# Patient Record
Sex: Male | Born: 1965 | Race: Black or African American | Hispanic: No | Marital: Single | Smoking: Never smoker
Health system: Southern US, Community
[De-identification: ages and names within clinical notes are randomized; demographics above are authoritative.]

## PROBLEM LIST (undated history)

## (undated) ENCOUNTER — Ambulatory Visit (HOSPITAL_COMMUNITY): Discharge status: Absent without leave | Payer: Self-pay

## (undated) DIAGNOSIS — F1910 Other psychoactive substance abuse, uncomplicated: Secondary | ICD-10-CM

## (undated) DIAGNOSIS — Z789 Other specified health status: Secondary | ICD-10-CM

---

## 2017-05-22 ENCOUNTER — Emergency Department (HOSPITAL_COMMUNITY)
Admission: EM | Admit: 2017-05-22 | Discharge: 2017-05-22 | Disposition: A | Discharge status: Home / Self Care | Payer: Self-pay | Attending: Emergency Medicine | Admitting: Emergency Medicine

## 2017-05-22 DIAGNOSIS — F1010 Alcohol abuse, uncomplicated: Secondary | ICD-10-CM | POA: Insufficient documentation

## 2017-05-22 DIAGNOSIS — R030 Elevated blood-pressure reading, without diagnosis of hypertension: Secondary | ICD-10-CM | POA: Insufficient documentation

## 2017-05-22 DIAGNOSIS — R531 Weakness: Secondary | ICD-10-CM | POA: Insufficient documentation

## 2017-05-22 LAB — COMPREHENSIVE METABOLIC PANEL
ALK PHOS: 39 U/L (ref 38–126)
ALT: 21 U/L (ref 17–63)
AST: 24 U/L (ref 15–41)
Albumin: 4 g/dL (ref 3.5–5.0)
Anion gap: 7 (ref 5–15)
BILIRUBIN TOTAL: 1.5 mg/dL — AB (ref 0.3–1.2)
BUN: 8 mg/dL (ref 6–20)
CALCIUM: 9.2 mg/dL (ref 8.9–10.3)
CO2: 23 mmol/L (ref 22–32)
Chloride: 106 mmol/L (ref 101–111)
Creatinine, Ser: 0.98 mg/dL (ref 0.61–1.24)
GFR calc Af Amer: >60 mL/min (ref >60)
GLUCOSE: 103 mg/dL — AB (ref 65–99)
POTASSIUM: 3.5 mmol/L (ref 3.5–5.1)
Sodium: 136 mmol/L (ref 135–145)
TOTAL PROTEIN: 7.3 g/dL (ref 6.5–8.1)

## 2017-05-22 LAB — CBC
HCT: 42 % (ref 39.0–52.0)
Hemoglobin: 14.2 g/dL (ref 13.0–17.0)
MCH: 28.6 pg (ref 26.0–34.0)
MCHC: 33.8 g/dL (ref 30.0–36.0)
MCV: 84.5 fL (ref 78.0–100.0)
PLATELETS: 233 10*3/uL (ref 150–400)
RBC: 4.97 MIL/uL (ref 4.22–5.81)
RDW: 13.1 % (ref 11.5–15.5)
WBC: 5.2 10*3/uL (ref 4.0–10.5)

## 2017-05-22 LAB — URINALYSIS, ROUTINE W REFLEX MICROSCOPIC
BILIRUBIN URINE: NEGATIVE
Glucose, UA: NEGATIVE mg/dL
Hgb urine dipstick: NEGATIVE
KETONES UR: NEGATIVE mg/dL
LEUKOCYTES UA: NEGATIVE
NITRITE: NEGATIVE
PROTEIN: NEGATIVE mg/dL
Specific Gravity, Urine: 1.004 — ABNORMAL LOW (ref 1.005–1.030)
pH: 6 (ref 5.0–8.0)

## 2017-05-22 LAB — I-STAT TROPONIN, ED: TROPONIN I, POC: 0 ng/mL (ref 0.00–0.08)

## 2017-05-22 LAB — LIPASE, BLOOD: Lipase: 39 U/L (ref 11–51)

## 2017-05-22 MED ORDER — SODIUM CHLORIDE 0.9 % IV BOLUS (SEPSIS)
1000.0000 mL | Freq: Once | INTRAVENOUS | Status: AC
  Administered 2017-05-22: 1000 mL via INTRAVENOUS

## 2017-05-22 NOTE — Discharge Instructions (Signed)
Make sure that you drink at least six 8 ounce glasses of water or Gatorade each day in order to stay well-hydrated. Call any of the numbers on the resource guide to get help with your alcohol problem. Call the number on these discharge instructions to get a primary care physician. Your blood pressure is elevated today at 162/ 79 and should be rechecked in a week at an urgent care center or at your new primary care physician's office

## 2017-05-22 NOTE — ED Provider Notes (Signed)
[facility] Provider Note   CSN: [id number] Arrival date & time: 05/22/17  1412     History   Chief Complaint Chief Complaint  Patient presents with  . Abdominal Pain    HPI Justin Middleton is a 51 y.o. male.complains of feelinglightheaded and generally weak for the past 2 days reports his stomach feels as if it's ""boiling" symptoms are not affected by eating or exertion.last bowel movement yesterday, normal. No fever. No treatment prior to coming here. Nothing makes symptoms better or worse. No chest pain no shortness of breath no nausea or vomiting.  HPI  No past medical history on file. Past medical history negative There are no active problems to display for this patient.   No past surgical history on file.     Home Medications    Prior to Admission medications   Not on File    Family History No family history on file.  Social History Social History  Substance Use Topics  . Smoking status: Not on file  . Smokeless tobacco: Not on file  . Alcohol use Not on file   No no tobacco or drinks approximately one sixpack of beer per day. Admits to marijuana use denies IV drug use  Allergies   Patient has no known allergies.   Review of Systems Review of Systems  Constitutional: Negative.   HENT: Negative.   Respiratory: Negative.   Cardiovascular: Negative.   Gastrointestinal: Positive for abdominal pain.       Vague abdominal discomfort  Musculoskeletal: Negative.   Skin: Negative.   Neurological: Positive for light-headedness.  Psychiatric/Behavioral: Negative.   All other systems reviewed and are negative.    Physical Exam Updated Vital Signs BP (!) 162/79 (BP Location: Right Arm)   Pulse (!) 57   Temp 98 F (36.7 C) (Oral)   Resp 18   SpO2 99%   Physical Exam  Constitutional: He appears well-developed and well-nourished.  HENT:  Head: Normocephalic and atraumatic.  Mucous membranes dry  Eyes: Pupils are equal, round, and reactive  to light. Conjunctivae are normal.  Neck: Neck supple. No tracheal deviation present. No thyromegaly present.  Cardiovascular: Regular rhythm.   No murmur heard. Pulmonary/Chest: Effort normal and breath sounds normal.  Abdominal: Soft. Bowel sounds are normal. He exhibits no distension. There is no tenderness.  Genitourinary: Penis normal.  Genitourinary Comments: Normal male genitalia  Musculoskeletal: Normal range of motion. He exhibits no edema or tenderness.  Neurological: He is alert. Coordination normal.  Skin: Skin is warm and dry. No rash noted.  Psychiatric: He has a normal mood and affect.  Nursing note and vitals reviewed.    ED Treatments / Results  Labs (all labs ordered are listed, but only abnormal results are displayed) Labs Reviewed  COMPREHENSIVE METABOLIC PANEL - Abnormal; Notable for the following:       Result Value   Glucose, Bld 103 (*)    Total Bilirubin 1.5 (*)    All other components within normal limits  URINALYSIS, ROUTINE W REFLEX MICROSCOPIC - Abnormal; Notable for the following:    Color, Urine STRAW (*)    Specific Gravity, Urine 1.004 (*)    All other components within normal limits  LIPASE, BLOOD  CBC  I-STAT TROPONIN, ED    EKG  EKG Interpretation  Date/Time:  Friday May 22 2017 20:21:06 EDT Ventricular Rate:  56 PR Interval:    QRS Duration: 94 QT Interval:  409 QTC Calculation: 395 R Axis:   57 Text  Interpretation:  Sinus rhythm No old tracing to compare Confirmed by Britt, Doreatha Martin [phone]) on 05/22/2017 9:18:17 PM       Radiology No results found.  Procedures Procedures (including critical care time)  Medications Ordered in ED Medications  sodium chloride 0.9 % bolus 1,000 mL (not administered)   Results for orders placed or performed during the hospital encounter of 05/22/17  Lipase, blood  Result Value Ref Range   Lipase 39 11 - 51 U/L  Comprehensive metabolic panel  Result Value Ref Range   Sodium 136 135  - 145 mmol/L   Potassium 3.5 3.5 - 5.1 mmol/L   Chloride 106 101 - 111 mmol/L   CO2 23 22 - 32 mmol/L   Glucose, Bld 103 (H) 65 - 99 mg/dL   BUN 8 6 - 20 mg/dL   Creatinine, Ser 6.04 0.61 - 1.24 mg/dL   Calcium 9.2 8.9 - 54.0 mg/dL   Total Protein 7.3 6.5 - 8.1 g/dL   Albumin 4.0 3.5 - 5.0 g/dL   AST 24 15 - 41 U/L   ALT 21 17 - 63 U/L   Alkaline Phosphatase 39 38 - 126 U/L   Total Bilirubin 1.5 (H) 0.3 - 1.2 mg/dL   GFR calc non Af Amer >60 >60 mL/min   GFR calc Af Amer >60 >60 mL/min   Anion gap 7 5 - 15  CBC  Result Value Ref Range   WBC 5.2 4.0 - 10.5 K/uL   RBC 4.97 4.22 - 5.81 MIL/uL   Hemoglobin 14.2 13.0 - 17.0 g/dL   HCT 98.1 19.1 - 47.8 %   MCV 84.5 78.0 - 100.0 fL   MCH 28.6 26.0 - 34.0 pg   MCHC 33.8 30.0 - 36.0 g/dL   RDW 29.5 62.1 - 30.8 %   Platelets 233 150 - 400 K/uL  Urinalysis, Routine w reflex microscopic  Result Value Ref Range   Color, Urine STRAW (A) YELLOW   APPearance CLEAR CLEAR   Specific Gravity, Urine 1.004 (L) 1.005 - 1.030   pH 6.0 5.0 - 8.0   Glucose, UA NEGATIVE NEGATIVE mg/dL   Hgb urine dipstick NEGATIVE NEGATIVE   Bilirubin Urine NEGATIVE NEGATIVE   Ketones, ur NEGATIVE NEGATIVE mg/dL   Protein, ur NEGATIVE NEGATIVE mg/dL   Nitrite NEGATIVE NEGATIVE   Leukocytes, UA NEGATIVE NEGATIVE  I-stat troponin, ED  Result Value Ref Range   Troponin i, poc 0.00 0.00 - 0.08 ng/mL   Comment 3           No results found.  Initial Impression / Assessment and Plan / ED Course  I have reviewed the triage vital signs and the nursing notes.  Pertinent labs & imaging results that were available during my care of the patient were reviewed by me and considered in my medical decision making (see chart for details).    9:20 PM feels much improved after treatment with intravenous hydration.  I had a lengthy discussion with patient. We're in agreement that he drinks too much alcohol. He'll be given resource guide for substance abuse. Also referral to  primary care. Blood pressure recheck one week. Oral hydration encouraged I suspect the patient is somewhat dehydrated as she feels improved after intravenous hydration and drinks too much alcohol.  Final Clinical Impressions(s) / ED Diagnoses  Dx #1 generalized weakness #2 alcohol abuse #3 elevatedblood pressure Final diagnoses:  None    New Prescriptions New Prescriptions   No medications on file     Yakima Kreitzer,  Jontez Redfield, MD 05/22/17 2125

## 2017-05-22 NOTE — ED Triage Notes (Addendum)
Pt states 2 days of centralized abdominal "boiling"? with nausea. Normal BMs. No emesis. No fevers. Denies abdominal pain just states " his stomach is boiling." States history of nausea without a cause.

## 2018-03-07 ENCOUNTER — Encounter (HOSPITAL_COMMUNITY): Payer: Self-pay | Admitting: Emergency Medicine

## 2018-03-07 ENCOUNTER — Emergency Department (HOSPITAL_COMMUNITY)
Admission: EM | Admit: 2018-03-07 | Discharge: 2018-03-07 | Disposition: A | Discharge status: Home / Self Care | Payer: Self-pay | Attending: Emergency Medicine | Admitting: Emergency Medicine

## 2018-03-07 ENCOUNTER — Other Ambulatory Visit: Payer: Self-pay

## 2018-03-07 DIAGNOSIS — Z036 Encounter for observation for suspected toxic effect from ingested substance ruled out: Secondary | ICD-10-CM | POA: Insufficient documentation

## 2018-03-07 DIAGNOSIS — Z5321 Procedure and treatment not carried out due to patient leaving prior to being seen by health care provider: Secondary | ICD-10-CM | POA: Insufficient documentation

## 2018-03-07 NOTE — ED Notes (Signed)
Called Pt to be roomed, no response in lobby x2.

## 2018-03-07 NOTE — ED Notes (Signed)
Called Pt to be roomed, no response x1 

## 2018-03-07 NOTE — ED Notes (Signed)
Pt called to room for 3 time no answer.
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
## 2018-10-13 ENCOUNTER — Ambulatory Visit (HOSPITAL_COMMUNITY)
Admission: EM | Admit: 2018-10-13 | Discharge: 2018-10-13 | Disposition: A | Discharge status: Home / Self Care | Payer: PRIVATE HEALTH INSURANCE | Attending: Family Medicine | Admitting: Family Medicine

## 2018-10-13 ENCOUNTER — Encounter (HOSPITAL_COMMUNITY): Payer: Self-pay | Admitting: Emergency Medicine

## 2018-10-13 DIAGNOSIS — N451 Epididymitis: Secondary | ICD-10-CM | POA: Diagnosis present

## 2018-10-13 MED ORDER — AZITHROMYCIN 250 MG PO TABS
1000.0000 mg | ORAL_TABLET | Freq: Once | ORAL | Status: AC
  Administered 2018-10-13: 1000 mg via ORAL

## 2018-10-13 MED ORDER — CEFTRIAXONE SODIUM 250 MG IJ SOLR
250.0000 mg | Freq: Once | INTRAMUSCULAR | Status: AC
  Administered 2018-10-13: 250 mg via INTRAMUSCULAR

## 2018-10-13 MED ORDER — CEFTRIAXONE SODIUM 250 MG IJ SOLR
INTRAMUSCULAR | Status: AC
  Filled 2018-10-13: qty 250

## 2018-10-13 MED ORDER — AZITHROMYCIN 250 MG PO TABS
ORAL_TABLET | ORAL | Status: AC
  Filled 2018-10-13: qty 4

## 2018-10-13 NOTE — ED Provider Notes (Signed)
[facility]    CSN: [id number] Arrival date & time: 10/13/18  1734     History   Chief Complaint Chief Complaint  Patient presents with  . Testicle Pain    HPI Justin Middleton is a 53 y.o. male.   Patient complains of right testicular pain along with urethral drainage..  No history of known exposure to [facility] or chlamydia  HPI  History reviewed. No pertinent past medical history.  There are no active problems to display for this patient.   History reviewed. No pertinent surgical history.     Home Medications    Prior to Admission medications   Not on File    Family History History reviewed. No pertinent family history.  Social History Social History   Tobacco Use  . Smoking status: Never Smoker  . Smokeless tobacco: Never Used  Substance Use Topics  . Alcohol use: Yes  . Drug use: Never     Allergies   Patient has no known allergies.   Review of Systems Review of Systems  Genitourinary: Positive for discharge and testicular pain.  All other systems reviewed and are negative.    Physical Exam Triage Vital Signs ED Triage Vitals  Enc Vitals Group     BP 10/13/18 1829 (!) 151/74     Pulse Rate 10/13/18 1829 65     Resp 10/13/18 1829 16     Temp 10/13/18 1829 98.3 F (36.8 C)     Temp Source 10/13/18 1829 Oral     SpO2 10/13/18 1829 100 %     Weight --      Height --      Head Circumference --      Peak Flow --      Pain Score 10/13/18 1830 3     Pain Loc --      Pain Edu? --      Excl. in GC? --    No data found.  Updated Vital Signs BP (!) 151/74 (BP Location: Right Arm)   Pulse 65   Temp 98.3 F (36.8 C) (Oral)   Resp 16   SpO2 100%   Visual Acuity Right Eye Distance:   Left Eye Distance:   Bilateral Distance:    Right Eye Near:   Left Eye Near:    Bilateral Near:     Physical Exam Constitutional:      Appearance: Normal appearance. He is normal weight.  Genitourinary:    Comments: Yellow penile drainage and  discharge There is tenderness of the epididymis on the right testicle Neurological:     Mental Status: He is alert.      UC Treatments / Results  Labs (all labs ordered are listed, but only abnormal results are displayed) Labs Reviewed  URINE CYTOLOGY ANCILLARY ONLY    EKG None  Radiology No results found.  Procedures Procedures (including critical care time)  Medications Ordered in UC Medications  cefTRIAXone (ROCEPHIN) injection 250 mg (has no administration in time range)  azithromycin (ZITHROMAX) tablet 1,000 mg (has no administration in time range)    Initial Impression / Assessment and Plan / UC Course  I have reviewed the triage vital signs and the nursing notes.  Pertinent labs & imaging results that were available during my care of the patient were reviewed by me and considered in my medical decision making (see chart for details).     Urethritis with possible epididymitis Final Clinical Impressions(s) / UC Diagnoses   Final diagnoses:  None  Discharge Instructions   None    ED Prescriptions    None     Controlled Substance Prescriptions [place] Controlled Substance Registry consulted? No   Frederica KusterMiller, Bir[place] Troia M, MD 10/13/18 [place]

## 2018-10-13 NOTE — ED Triage Notes (Signed)
Pt presents to [facility] for assessment of bilateral testicular pain and clear discharge,  States he has been tested in the past and he was negative.  States symptoms keeps going and coming back.

## 2018-10-16 LAB — URINE CYTOLOGY ANCILLARY ONLY
CHLAMYDIA, DNA PROBE: NEGATIVE
Neisseria Gonorrhea: NEGATIVE
TRICH (WINDOWPATH): NEGATIVE

## 2018-10-19 ENCOUNTER — Telehealth (HOSPITAL_COMMUNITY): Payer: Self-pay | Admitting: Emergency Medicine

## 2018-10-19 NOTE — Telephone Encounter (Signed)
Returned patients voicemail about test results. No answer. LVVM. Test results are normal if patient calls back.

## 2018-11-08 ENCOUNTER — Telehealth (HOSPITAL_COMMUNITY): Payer: Self-pay | Admitting: Emergency Medicine

## 2018-11-08 NOTE — Telephone Encounter (Signed)
Returned patients voicemail. No answer 

## 2018-11-10 ENCOUNTER — Ambulatory Visit (HOSPITAL_COMMUNITY)
Admission: EM | Admit: 2018-11-10 | Discharge: 2018-11-10 | Disposition: A | Discharge status: Home / Self Care | Payer: PRIVATE HEALTH INSURANCE | Attending: Family Medicine | Admitting: Family Medicine

## 2018-11-10 ENCOUNTER — Encounter (HOSPITAL_COMMUNITY): Payer: Self-pay

## 2018-11-10 DIAGNOSIS — R05 Cough: Secondary | ICD-10-CM

## 2018-11-10 DIAGNOSIS — R059 Cough, unspecified: Secondary | ICD-10-CM

## 2018-11-10 DIAGNOSIS — R509 Fever, unspecified: Secondary | ICD-10-CM

## 2018-11-10 DIAGNOSIS — R52 Pain, unspecified: Secondary | ICD-10-CM

## 2018-11-10 DIAGNOSIS — R5383 Other fatigue: Secondary | ICD-10-CM | POA: Diagnosis not present

## 2018-11-10 DIAGNOSIS — R69 Illness, unspecified: Principal | ICD-10-CM

## 2018-11-10 DIAGNOSIS — J111 Influenza due to unidentified influenza virus with other respiratory manifestations: Secondary | ICD-10-CM

## 2018-11-10 DIAGNOSIS — R0981 Nasal congestion: Secondary | ICD-10-CM

## 2018-11-10 MED ORDER — PREDNISONE 10 MG (21) PO TBPK: ORAL_TABLET | Freq: Every day | ORAL | 0 refills | Status: DC

## 2018-11-10 MED ORDER — BENZONATATE 100 MG PO CAPS: 100.0000 mg | ORAL_CAPSULE | Freq: Three times a day (TID) | ORAL | 0 refills | Status: DC

## 2018-11-10 NOTE — ED Triage Notes (Signed)
Pt presents with productive cough with thick mucus, chills, body aches, and vomiting.

## 2018-11-10 NOTE — Discharge Instructions (Signed)

## 2018-11-16 NOTE — ED Provider Notes (Signed)
[facility]   [id number] 11/10/18 Arrival Time: 1748  ASSESSMENT & PLAN:  1. Influenza-like illness   2. Cough     See AVS for discharge instructions.  Meds ordered this encounter  Medications  . predniSONE (STERAPRED UNI-PAK 21 TAB) 10 MG (21) TBPK tablet    Sig: Take by mouth daily. Take as directed.    Dispense:  21 tablet    Refill:  0  . benzonatate (TESSALON) 100 MG capsule    Sig: Take 1 capsule (100 mg total) by mouth every 8 (eight) hours.    Dispense:  21 capsule    Refill:  0    Discussed typical duration of symptoms. OTC symptom care as needed. Ensure adequate fluid intake and rest. May f/u with PCP or here as needed.  Reviewed expectations re: course of current medical issues. Questions answered. Outlined signs and symptoms indicating need for more acute intervention. Patient verbalized understanding. After Visit Summary given.   SUBJECTIVE: History from: patient.  Justin Middleton is a 53 y.o. male who presents with complaint of nasal congestion, post-nasal drainage, and a persistent dry cough; without sore throat. Onset abrupt, at least 3 d ago; with fatigue and with body aches. SOB: none. Wheezing: none. Fever: yes, subjective with chills. Overall normal PO intake without n/v. Known sick contacts: no. No specific or significant aggravating or alleviating factors reported. OTC treatment: none reported.   Social History   Tobacco Use  Smoking Status Never Smoker  Smokeless Tobacco Never Used    ROS: As per HPI.   OBJECTIVE:  Vitals:   11/10/18 1929  BP: (!) 147/91  Pulse: 67  Resp: 18  Temp: 97.9 F (36.6 C)  TempSrc: Oral  SpO2: 95%     General appearance: alert; appears fatigued HEENT: nasal congestion; clear runny nose; throat irritation secondary to post-nasal drainage Neck: supple without LAD CV: RRR Lungs: unlabored respirations, symmetrical air entry without wheezing; cough: moderate Abd: soft Ext: no LE edema Skin:  warm and dry Psychological: alert and cooperative; normal mood and affect    No Known Allergies  History reviewed. No pertinent past medical history. History reviewed. No pertinent family history. Social History   Socioeconomic History  . Marital status: Single    Spouse name: Not on file  . Number of children: Not on file  . Years of education: Not on file  . Highest education level: Not on file  Occupational History  . Not on file  Social Needs  . Financial resource strain: Not on file  . Food insecurity:    Worry: Not on file    Inability: Not on file  . Transportation needs:    Medical: Not on file    Non-medical: Not on file  Tobacco Use  . Smoking status: Never Smoker  . Smokeless tobacco: Never Used  Substance and Sexual Activity  . Alcohol use: Yes  . Drug use: Never  . Sexual activity: Not on file  Lifestyle  . Physical activity:    Days per week: Not on file    Minutes per session: Not on file  . Stress: Not on file  Relationships  . Social connections:    Talks on phone: Not on file    Gets together: Not on file    Attends religious service: Not on file    Active member of club or organization: Not on file    Attends meetings of clubs or organizations: Not on file    Relationship status: Not  on file  . Intimate partner violence:    Fear of current or ex partner: Not on file    Emotionally abused: Not on file    Physically abused: Not on file    Forced sexual activity: Not on file  Other Topics Concern  . Not on file  Social History Narrative  . Not on file           Mardella Layman, MD 11/16/18 [phone]

## 2019-04-02 ENCOUNTER — Other Ambulatory Visit: Payer: Self-pay

## 2019-04-02 DIAGNOSIS — Z20822 Contact with and (suspected) exposure to covid-19: Secondary | ICD-10-CM

## 2019-04-05 LAB — NOVEL CORONAVIRUS, NAA: SARS-CoV-2, NAA: NOT DETECTED

## 2019-04-19 ENCOUNTER — Encounter (HOSPITAL_COMMUNITY): Payer: Self-pay | Admitting: Emergency Medicine

## 2019-04-19 ENCOUNTER — Ambulatory Visit (HOSPITAL_COMMUNITY)
Admission: EM | Admit: 2019-04-19 | Discharge: 2019-04-19 | Disposition: A | Discharge status: Home / Self Care | Payer: BLUE CROSS/BLUE SHIELD | Attending: Emergency Medicine | Admitting: Emergency Medicine

## 2019-04-19 ENCOUNTER — Other Ambulatory Visit: Payer: Self-pay

## 2019-04-19 DIAGNOSIS — S50861A Insect bite (nonvenomous) of right forearm, initial encounter: Secondary | ICD-10-CM

## 2019-04-19 DIAGNOSIS — L03113 Cellulitis of right upper limb: Secondary | ICD-10-CM

## 2019-04-19 DIAGNOSIS — W57XXXA Bitten or stung by nonvenomous insect and other nonvenomous arthropods, initial encounter: Secondary | ICD-10-CM

## 2019-04-19 MED ORDER — CEPHALEXIN 500 MG PO CAPS: 500.0000 mg | ORAL_CAPSULE | Freq: Four times a day (QID) | ORAL | 0 refills | Status: AC

## 2019-04-19 NOTE — ED Provider Notes (Addendum)
[facility]    CSN: [id number] Arrival date & time: 04/19/19  1815     History   Chief Complaint Chief Complaint  Patient presents with  . Insect Bite    HPI Justin Middleton is a 53 y.o. male.   Patient presents with insect bite on his right forearm x2 days.  He states the area has increased redness and swelling today.  He denies fever, chills, drainage from the wound, or other symptoms.  No treatments attempted at home.  He denies numbness, paresthesias, weakness in his fingers, hand, or arm.    The history is provided by the patient.    History reviewed. No pertinent past medical history.  There are no active problems to display for this patient.   History reviewed. No pertinent surgical history.     Home Medications    Prior to Admission medications   Medication Sig Start Date End Date Taking? Authorizing Provider  benzonatate (TESSALON) 100 MG capsule Take 1 capsule (100 mg total) by mouth every 8 (eight) hours. Patient not taking: Reported on 04/19/2019 11/10/18   Mardella LaymanHagler, Brian, MD  cephALEXin (KEFLEX) 500 MG capsule Take 1 capsule (500 mg total) by mouth 4 (four) times daily for 7 days. 04/19/19 04/26/19  Mickie Bailate, Constance Whittle H, NP  predniSONE (STERAPRED UNI-PAK 21 TAB) 10 MG (21) TBPK tablet Take by mouth daily. Take as directed. Patient not taking: Reported on 04/19/2019 11/10/18   Mardella LaymanHagler, Brian, MD    Family History History reviewed. No pertinent family history.  Social History Social History   Tobacco Use  . Smoking status: Never Smoker  . Smokeless tobacco: Never Used  Substance Use Topics  . Alcohol use: Yes  . Drug use: Never     Allergies   Patient has no known allergies.   Review of Systems Review of Systems  Constitutional: Negative for chills and fever.  HENT: Negative for ear pain and sore throat.   Eyes: Negative for pain and visual disturbance.  Respiratory: Negative for cough and shortness of breath.   Cardiovascular: Negative for  chest pain and palpitations.  Gastrointestinal: Negative for abdominal pain and vomiting.  Genitourinary: Negative for dysuria and hematuria.  Musculoskeletal: Negative for arthralgias and back pain.  Skin: Positive for wound. Negative for color change and rash.  Neurological: Negative for seizures, syncope, weakness and numbness.  All other systems reviewed and are negative.    Physical Exam Triage Vital Signs ED Triage Vitals  Enc Vitals Group     BP 04/19/19 1903 140/89     Pulse Rate 04/19/19 1903 65     Resp 04/19/19 1903 18     Temp 04/19/19 1903 98.4 F (36.9 C)     Temp Source 04/19/19 1903 Temporal     SpO2 04/19/19 1903 98 %     Weight --      Height --      Head Circumference --      Peak Flow --      Pain Score 04/19/19 1904 4     Pain Loc --      Pain Edu? --      Excl. in GC? --    No data found.  Updated Vital Signs BP 140/89 (BP Location: Left Arm)   Pulse 65   Temp 98.4 F (36.9 C) (Temporal)   Resp 18   SpO2 98%   Visual Acuity Right Eye Distance:   Left Eye Distance:   Bilateral Distance:    Right Eye  Near:   Left Eye Near:    Bilateral Near:     Physical Exam Vitals signs and nursing note reviewed.  Constitutional:      Appearance: He is well-developed.  HENT:     Head: Normocephalic and atraumatic.  Eyes:     Conjunctiva/sclera: Conjunctivae normal.  Neck:     Musculoskeletal: Neck supple.  Cardiovascular:     Rate and Rhythm: Normal rate and regular rhythm.     Heart sounds: No murmur.  Pulmonary:     Effort: Pulmonary effort is normal. No respiratory distress.     Breath sounds: Normal breath sounds.  Abdominal:     Palpations: Abdomen is soft.     Tenderness: There is no abdominal tenderness.  Musculoskeletal: Normal range of motion.        General: Swelling present.  Skin:    General: Skin is warm and dry.     Findings: Erythema and lesion present.     Comments: 2 cm area of induration with surrounding erythema.  See  picture for details.  Neurological:     General: No focal deficit present.     Mental Status: He is alert.     Sensory: No sensory deficit.     Motor: No weakness.        UC Treatments / Results  Labs (all labs ordered are listed, but only abnormal results are displayed) Labs Reviewed - No data to display  EKG   Radiology No results found.  Procedures Procedures (including critical care time)  Medications Ordered in UC Medications - No data to display  Initial Impression / Assessment and Plan / UC Course  I have reviewed the triage vital signs and the nursing notes.  Pertinent labs & imaging results that were available during my care of the patient were reviewed by me and considered in my medical decision making (see chart for details).    Insect bite on right forearm, cellulitis.  Treating with Keflex.  Instructed patient to take Benadryl as needed for itching; precautions given for Benadryl.  Instructed patient to return here or follow-up with his PCP if his redness, pain, swelling increases; or if he develops other symptoms such as fever, chills.     Final Clinical Impressions(s) / UC Diagnoses   Final diagnoses:  Insect bite of right forearm, initial encounter  Cellulitis of right upper extremity     Discharge Instructions     Take the prescribed antibiotic as directed.    Take Benadryl as needed for itching.  Keep in mind that this medication may make you drowsy so do not drive, operate machinery or drink alcohol while taking it.    Return here or follow-up with your primary care provider if you have increased redness, pain, or swelling; or if you develop fever, chills, or other concerning symptoms.        ED Prescriptions    Medication Sig Dispense Auth. Provider   cephALEXin (KEFLEX) 500 MG capsule Take 1 capsule (500 mg total) by mouth 4 (four) times daily for 7 days. 28 capsule Sharion Balloon, NP     Controlled Substance Prescriptions [place]  Controlled Substance Registry consulted? Not Applicable   Sharion Balloon, NP 04/19/19 1932    Sharion Balloon, NP 04/19/19 1941

## 2019-04-19 NOTE — Discharge Instructions (Signed)
Take the prescribed antibiotic as directed.    Take Benadryl as needed for itching.  Keep in mind that this medication may make you drowsy so do not drive, operate machinery or drink alcohol while taking it.    Return here or follow-up with your primary care provider if you have increased redness, pain, or swelling; or if you develop fever, chills, or other concerning symptoms.

## 2019-04-19 NOTE — ED Triage Notes (Signed)
Pt here for insect bite to right arm with increased redness and swelling

## 2019-05-13 ENCOUNTER — Ambulatory Visit (HOSPITAL_COMMUNITY)
Admission: EM | Admit: 2019-05-13 | Discharge: 2019-05-13 | Disposition: A | Discharge status: Home / Self Care | Payer: BLUE CROSS/BLUE SHIELD | Attending: Family Medicine | Admitting: Family Medicine

## 2019-05-13 ENCOUNTER — Ambulatory Visit (INDEPENDENT_AMBULATORY_CARE_PROVIDER_SITE_OTHER): Payer: BLUE CROSS/BLUE SHIELD

## 2019-05-13 ENCOUNTER — Encounter (HOSPITAL_COMMUNITY): Payer: Self-pay

## 2019-05-13 ENCOUNTER — Other Ambulatory Visit: Payer: Self-pay

## 2019-05-13 DIAGNOSIS — S161XXA Strain of muscle, fascia and tendon at neck level, initial encounter: Secondary | ICD-10-CM

## 2019-05-13 MED ORDER — IBUPROFEN 800 MG PO TABS
800.0000 mg | ORAL_TABLET | Freq: Once | ORAL | Status: AC
  Administered 2019-05-13: 20:00:00 800 mg via ORAL

## 2019-05-13 MED ORDER — NAPROXEN 500 MG PO TABS: 500.0000 mg | ORAL_TABLET | Freq: Two times a day (BID) | ORAL | 0 refills | Status: DC

## 2019-05-13 MED ORDER — CYCLOBENZAPRINE HCL 5 MG PO TABS: 5.0000 mg | ORAL_TABLET | Freq: Every day | ORAL | 0 refills | Status: DC

## 2019-05-13 MED ORDER — IBUPROFEN 800 MG PO TABS
ORAL_TABLET | ORAL | Status: AC
  Filled 2019-05-13: qty 1
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
## 2019-07-03 ENCOUNTER — Emergency Department (HOSPITAL_COMMUNITY)
Admission: EM | Admit: 2019-07-03 | Discharge: 2019-07-03 | Disposition: A | Discharge status: Home / Self Care | Payer: BLUE CROSS/BLUE SHIELD | Attending: Emergency Medicine | Admitting: Emergency Medicine

## 2019-07-03 ENCOUNTER — Encounter (HOSPITAL_COMMUNITY): Payer: Self-pay | Admitting: Emergency Medicine

## 2019-07-03 ENCOUNTER — Other Ambulatory Visit: Payer: Self-pay

## 2019-07-03 DIAGNOSIS — Z5321 Procedure and treatment not carried out due to patient leaving prior to being seen by health care provider: Secondary | ICD-10-CM | POA: Diagnosis not present

## 2019-07-03 DIAGNOSIS — R6883 Chills (without fever): Secondary | ICD-10-CM | POA: Insufficient documentation

## 2019-07-03 NOTE — ED Notes (Signed)
Patient observed walking out, was not able to advise patient to stay.
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
## 2019-07-24 ENCOUNTER — Other Ambulatory Visit: Payer: Self-pay

## 2019-07-24 DIAGNOSIS — Z20822 Contact with and (suspected) exposure to covid-19: Secondary | ICD-10-CM

## 2019-07-25 LAB — NOVEL CORONAVIRUS, NAA: SARS-CoV-2, NAA: NOT DETECTED

## 2019-09-06 ENCOUNTER — Ambulatory Visit (HOSPITAL_COMMUNITY)
Admission: EM | Admit: 2019-09-06 | Discharge: 2019-09-06 | Disposition: A | Discharge status: Home / Self Care | Payer: BLUE CROSS/BLUE SHIELD | Attending: Family Medicine | Admitting: Family Medicine

## 2019-09-06 ENCOUNTER — Encounter (HOSPITAL_COMMUNITY): Payer: Self-pay

## 2019-09-06 ENCOUNTER — Other Ambulatory Visit: Payer: Self-pay

## 2019-09-06 DIAGNOSIS — K0889 Other specified disorders of teeth and supporting structures: Secondary | ICD-10-CM | POA: Diagnosis present

## 2019-09-06 DIAGNOSIS — R030 Elevated blood-pressure reading, without diagnosis of hypertension: Secondary | ICD-10-CM | POA: Diagnosis present

## 2019-09-06 DIAGNOSIS — R369 Urethral discharge, unspecified: Secondary | ICD-10-CM | POA: Diagnosis present

## 2019-09-06 MED ORDER — IBUPROFEN 800 MG PO TABS
800.0000 mg | ORAL_TABLET | Freq: Once | ORAL | Status: AC
  Administered 2019-09-06: 14:00:00 800 mg via ORAL

## 2019-09-06 MED ORDER — CEFTRIAXONE SODIUM 250 MG IJ SOLR
INTRAMUSCULAR | Status: AC
  Filled 2019-09-06: qty 250

## 2019-09-06 MED ORDER — AZITHROMYCIN 250 MG PO TABS
1000.0000 mg | ORAL_TABLET | Freq: Once | ORAL | Status: AC
  Administered 2019-09-06: 13:00:00 1000 mg via ORAL

## 2019-09-06 MED ORDER — TRAMADOL HCL 50 MG PO TABS: 50.0000 mg | ORAL_TABLET | Freq: Four times a day (QID) | ORAL | 0 refills | Status: DC | PRN

## 2019-09-06 MED ORDER — IBUPROFEN 800 MG PO TABS
ORAL_TABLET | ORAL | Status: AC
  Filled 2019-09-06: qty 1

## 2019-09-06 MED ORDER — CEFTRIAXONE SODIUM 250 MG IJ SOLR
250.0000 mg | Freq: Once | INTRAMUSCULAR | Status: AC
  Administered 2019-09-06: 250 mg via INTRAMUSCULAR

## 2019-09-06 MED ORDER — LIDOCAINE HCL (PF) 1 % IJ SOLN
INTRAMUSCULAR | Status: AC
  Filled 2019-09-06: qty 2

## 2019-09-06 MED ORDER — AZITHROMYCIN 250 MG PO TABS
ORAL_TABLET | ORAL | Status: AC
  Filled 2019-09-06: qty 4

## 2019-09-06 MED ORDER — PENICILLIN V POTASSIUM 500 MG PO TABS: 500.0000 mg | ORAL_TABLET | Freq: Three times a day (TID) | ORAL | 0 refills | Status: DC

## 2019-09-06 NOTE — Discharge Instructions (Addendum)
You have been given the following medications today for treatment of suspected gonorrhea and/or chlamydia:  cefTRIAXone (ROCEPHIN) injection 250 mg azithromycin (ZITHROMAX) tablet 1,000 mg  Even though we have treated you today, we have sent testing for sexually transmitted infections. We will notify you of any positive results once they are received. If required, we will prescribe any medications you might need.  Please refrain from all sexual activity for at least the next seven days.  Your blood pressure was noted to be elevated during your visit today. You may return here within the next few days to recheck if unable to see your primary care doctor. If your blood pressure remains persistently elevated, you may need to begin taking a medication.  BP (!) 189/81 (BP Location: Right Arm)    Pulse 65    Temp 97.8 F (36.6 C) (Oral)    Resp 18    Wt 74.8 kg    SpO2 100%    BMI 25.84 kg/m   Be aware, pain medications may cause drowsiness. Please do not drive, operate heavy machinery or make important decisions while on this medication, it can cloud your judgement.

## 2019-09-06 NOTE — ED Triage Notes (Signed)
Pt states he needs STD testing done. Pt has a bad tooth he has a toothache.

## 2019-09-06 NOTE — ED Notes (Signed)
On discharge from ucc, patient repeatedly asking about pain medicine for tooth pain.  Made aware that there is medicine at pharmacy-patient insisted on medicine at [facility]

## 2019-09-06 NOTE — ED Provider Notes (Signed)
Justin Middleton   [id number] 09/06/19 Arrival Time: 7001  ASSESSMENT & PLAN:  1. Penile discharge   2. Pain, dental   3. Elevated blood pressure reading without diagnosis of hypertension     No dental abscess appreciated.   Discharge Instructions     You have been given the following medications today for treatment of suspected gonorrhea and/or chlamydia:  cefTRIAXone (ROCEPHIN) injection 250 mg azithromycin (ZITHROMAX) tablet 1,000 mg  Even though we have treated you today, we have sent testing for sexually transmitted infections. We will notify you of any positive results once they are received. If required, we will prescribe any medications you might need.  Please refrain from all sexual activity for at least the next seven days.  Your blood pressure was noted to be elevated during your visit today. You may return here within the next few days to recheck if unable to see your primary care doctor. If your blood pressure remains persistently elevated, you may need to begin taking a medication.  BP (!) 189/81 (BP Location: Right Arm)   Pulse 65   Temp 97.8 F (36.6 C) (Oral)   Resp 18   Wt 74.8 kg   SpO2 100%   BMI 25.84 kg/m   Be aware, pain medications may cause drowsiness. Please do not drive, operate heavy machinery or make important decisions while on this medication, it can cloud your judgement.     Meds prescribed this encounter:  Medications  . traMADol (ULTRAM) 50 MG tablet    Sig: Take 1 tablet (50 mg total) by mouth every 6 (six) hours as needed.    Dispense:  15 tablet    Refill:  0  . penicillin v potassium (VEETID) 500 MG tablet    Sig: Take 1 tablet (500 mg total) by mouth 3 (three) times daily.    Dispense:  30 tablet    Refill:  0     Pending: Labs Reviewed  CYTOLOGY, (ORAL, ANAL, URETHRAL) ANCILLARY ONLY    Will notify of any positive results. Instructed to refrain from sexual activity for at least seven days.  Reviewed  expectations re: course of current medical issues. Questions answered. Outlined signs and symptoms indicating need for more acute intervention. Patient verbalized understanding. After Visit Summary given.   SUBJECTIVE:  Justin Middleton is a 53 y.o. male who presents with complaint of penile discharge. Onset abrupt. First noticed 1-2 d ago. Describes discharge as thick and opaque. No specific aggravating or alleviating factors reported. Denies: urinary frequency and dysuria. Afebrile. No abdominal or pelvic pain. No n/v. No rashes or lesions. Reports that he is sexually active with multiple male partners; with regular condom use. Reports recent unprotected sex last week before discharge started. OTC treatment: none. History of STI: none reported.  Also reports dull dental pain; R lower rear molars; known decay; trying to get in to see a dentist. Afebrile. Tolerating PO but painful when chewing on the R. Some hot/cold sensitivity. OTC ibuprofen not helping much with the pain.  Increased blood pressure noted today. Reports that he has not been treated for hypertension in the past.  He reports no chest pain on exertion, no dyspnea on exertion, no swelling of ankles, no orthostatic dizziness or lightheadedness, no orthopnea or paroxysmal nocturnal dyspnea, no palpitations and no intermittent claudication symptoms.   ROS: As per HPI. All other systems negative.   OBJECTIVE:  Vitals:   09/06/19 1156 09/06/19 1159  BP: (!) 189/81   Pulse: 65  Resp: 18   Temp: 97.8 F (36.6 C)   TempSrc: Oral   SpO2: 100%   Weight:  74.8 kg     General appearance: alert, cooperative, appears stated age and no distress Throat: lips, mucosa, and tongue normal; teeth and gums normal CV: RRR Lungs: CTAB; unlabored Back: no CVA tenderness; FROM at waist Abdomen: soft, non-tender GU: normal appearing genitalia Ext: no edema Skin: warm and dry Psychological: alert and cooperative; normal mood and  affect.    Labs Reviewed  CYTOLOGY, (ORAL, ANAL, URETHRAL) ANCILLARY ONLY    No Known Allergies   Family History  Problem Relation Age of Onset  . Diabetes Mother   . Healthy Father    Social History   Socioeconomic History  . Marital status: Single    Spouse name: Not on file  . Number of children: Not on file  . Years of education: Not on file  . Highest education level: Not on file  Occupational History  . Not on file  Tobacco Use  . Smoking status: Never Smoker  . Smokeless tobacco: Never Used  Substance and Sexual Activity  . Alcohol use: Yes    Comment: weekends  . Drug use: Never  . Sexual activity: Not on file  Other Topics Concern  . Not on file  Social History Narrative  . Not on file   Social Determinants of Health   Financial Resource Strain:   . Difficulty of Paying Living Expenses: Not on file  Food Insecurity:   . Worried About Programme researcher, broadcasting/film/video in the Last Year: Not on file  . Ran Out of Food in the Last Year: Not on file  Transportation Needs:   . Lack of Transportation (Medical): Not on file  . Lack of Transportation (Non-Medical): Not on file  Physical Activity:   . Days of Exercise per Week: Not on file  . Minutes of Exercise per Session: Not on file  Stress:   . Feeling of Stress : Not on file  Social Connections:   . Frequency of Communication with Friends and Family: Not on file  . Frequency of Social Gatherings with Friends and Family: Not on file  . Attends Religious Services: Not on file  . Active Member of Clubs or Organizations: Not on file  . Attends Banker Meetings: Not on file  . Marital Status: Not on file  Intimate Partner Violence:   . Fear of Current or Ex-Partner: Not on file  . Emotionally Abused: Not on file  . Physically Abused: Not on file  . Sexually Abused: Not on file          Justin Layman, MD 09/07/19 [phone]

## 2019-09-07 LAB — CYTOLOGY, (ORAL, ANAL, URETHRAL) ANCILLARY ONLY
Chlamydia: NEGATIVE
Neisseria Gonorrhea: NEGATIVE
Trichomonas: POSITIVE — AB

## 2019-09-08 ENCOUNTER — Telehealth (HOSPITAL_COMMUNITY): Payer: Self-pay | Admitting: Emergency Medicine

## 2019-09-08 MED ORDER — METRONIDAZOLE 500 MG PO TABS: 2000.0000 mg | ORAL_TABLET | Freq: Once | ORAL | 0 refills | Status: AC

## 2019-09-08 NOTE — Telephone Encounter (Signed)
Trichomonas is positive. Rx  for Flagyl 2 grams, once was sent to the pharmacy of record. Pt needs education to refrain from sexual intercourse for 7 days to give the medicine time to work. Sexual partners need to be notified and tested/treated. Condoms may reduce risk of reinfection. Recheck for further evaluation if symptoms are not improving.   Patient contacted by phone and made aware of    results. Pt verbalized understanding and had all questions answered.    

## 2019-09-20 ENCOUNTER — Ambulatory Visit (HOSPITAL_COMMUNITY)
Admission: EM | Admit: 2019-09-20 | Discharge: 2019-09-20 | Disposition: A | Discharge status: Home / Self Care | Payer: BLUE CROSS/BLUE SHIELD | Attending: Family Medicine | Admitting: Family Medicine

## 2019-09-20 ENCOUNTER — Other Ambulatory Visit: Payer: Self-pay

## 2019-09-20 ENCOUNTER — Encounter (HOSPITAL_COMMUNITY): Payer: Self-pay

## 2019-09-20 DIAGNOSIS — Z7952 Long term (current) use of systemic steroids: Secondary | ICD-10-CM | POA: Diagnosis not present

## 2019-09-20 DIAGNOSIS — Z791 Long term (current) use of non-steroidal anti-inflammatories (NSAID): Secondary | ICD-10-CM | POA: Insufficient documentation

## 2019-09-20 DIAGNOSIS — Z79899 Other long term (current) drug therapy: Secondary | ICD-10-CM | POA: Diagnosis not present

## 2019-09-20 DIAGNOSIS — R195 Other fecal abnormalities: Secondary | ICD-10-CM

## 2019-09-20 DIAGNOSIS — Z833 Family history of diabetes mellitus: Secondary | ICD-10-CM | POA: Insufficient documentation

## 2019-09-20 DIAGNOSIS — R197 Diarrhea, unspecified: Secondary | ICD-10-CM | POA: Diagnosis not present

## 2019-09-20 DIAGNOSIS — R0789 Other chest pain: Secondary | ICD-10-CM

## 2019-09-20 DIAGNOSIS — U071 COVID-19: Secondary | ICD-10-CM | POA: Insufficient documentation

## 2019-09-20 DIAGNOSIS — J069 Acute upper respiratory infection, unspecified: Secondary | ICD-10-CM | POA: Diagnosis not present

## 2019-09-20 DIAGNOSIS — R05 Cough: Secondary | ICD-10-CM | POA: Diagnosis not present

## 2019-09-20 LAB — POC SARS CORONAVIRUS 2 AG: SARS Coronavirus 2 Ag: NEGATIVE

## 2019-09-20 LAB — POC SARS CORONAVIRUS 2 AG -  ED
SARS Coronavirus 2 Ag: NEGATIVE
SARS Coronavirus 2 Ag: NEGATIVE

## 2019-09-20 MED ORDER — PROMETHAZINE-DM 6.25-15 MG/5ML PO SYRP: 5.0000 mL | ORAL_SOLUTION | Freq: Every evening | ORAL | 0 refills | Status: DC | PRN

## 2019-09-20 MED ORDER — BENZONATATE 100 MG PO CAPS: 100.0000 mg | ORAL_CAPSULE | Freq: Three times a day (TID) | ORAL | 0 refills | Status: DC | PRN

## 2019-09-20 NOTE — ED Triage Notes (Signed)
Pt states he has a cough and has abdominal discomfort. Pt states this has been going on since Friday.

## 2019-09-20 NOTE — Discharge Instructions (Signed)
We will manage this as a viral syndrome. For sore throat or cough try using a honey-based tea. Use 3 teaspoons of honey with juice squeezed from half lemon. Place shaved pieces of ginger into 1/2-1 cup of water and warm over stove top. Then mix the ingredients and repeat every 4 hours as needed. Please take Tylenol 500mg every 6 hours. Hydrate very well with at least 2 liters of water. Eat light meals such as soups to replenish electrolytes and soft fruits, veggies. Start an antihistamine like Zyrtec (cetirizine) at 10mg daily for postnasal drainage, sinus congestion.  You can take this together with pseudoephedrine (Sudafed) at a dose of 30 mg 3 times a day or twice daily as needed for the same kind of congestion.   

## 2019-09-20 NOTE — ED Provider Notes (Signed)
[place]   MRN: [id number] DOB: Dec 07, 1965  Subjective:   Justin Middleton is a 54 y.o. male presenting for 4 day history of persistent dry cough that elicits chest pain. Has had diarrhea in the past few days. Has not tried medications for relief. Denies smoking. Denies asthma, COPD.  No current facility-administered medications for this encounter.  Current Outpatient Medications:  .  benzonatate (TESSALON) 100 MG capsule, Take 1 capsule (100 mg total) by mouth every 8 (eight) hours. (Patient not taking: Reported on 04/19/2019), Disp: 21 capsule, Rfl: 0 .  cyclobenzaprine (FLEXERIL) 5 MG tablet, Take 1 tablet (5 mg total) by mouth at bedtime., Disp: 15 tablet, Rfl: 0 .  naproxen (NAPROSYN) 500 MG tablet, Take 1 tablet (500 mg total) by mouth 2 (two) times daily., Disp: 30 tablet, Rfl: 0 .  penicillin v potassium (VEETID) 500 MG tablet, Take 1 tablet (500 mg total) by mouth 3 (three) times daily., Disp: 30 tablet, Rfl: 0 .  predniSONE (STERAPRED UNI-PAK 21 TAB) 10 MG (21) TBPK tablet, Take by mouth daily. Take as directed. (Patient not taking: Reported on 04/19/2019), Disp: 21 tablet, Rfl: 0 .  traMADol (ULTRAM) 50 MG tablet, Take 1 tablet (50 mg total) by mouth every 6 (six) hours as needed., Disp: 15 tablet, Rfl: 0   No Known Allergies  History reviewed. No pertinent past medical history.   History reviewed. No pertinent surgical history.  Family History  Problem Relation Age of Onset  . Diabetes Mother   . Healthy Father     Social History   Tobacco Use  . Smoking status: Never Smoker  . Smokeless tobacco: Never Used  Substance Use Topics  . Alcohol use: Yes    Comment: weekends  . Drug use: Never    Review of Systems  Constitutional: Negative for fever and malaise/fatigue.  HENT: Negative for congestion, ear pain, sinus pain and sore throat.   Eyes: Negative for discharge and redness.  Respiratory: Positive for cough. Negative for hemoptysis, shortness of breath  and wheezing.   Cardiovascular: Positive for chest pain (from coughing).  Gastrointestinal: Positive for diarrhea. Negative for abdominal pain, blood in stool, constipation, nausea and vomiting.  Genitourinary: Negative for dysuria, flank pain and hematuria.  Musculoskeletal: Negative for myalgias.  Skin: Negative for rash.  Neurological: Negative for dizziness, weakness and headaches.  Psychiatric/Behavioral: Negative for depression and substance abuse.    Objective:   Vitals: BP (!) 147/76 (BP Location: Right Arm)   Pulse 88   Temp 99.7 F (37.6 C)   Resp 18   Wt 170 lb (77.1 kg)   SpO2 100%   BMI 26.63 kg/m   Physical Exam Constitutional:      General: He is not in acute distress.    Appearance: Normal appearance. He is well-developed. He is not ill-appearing, toxic-appearing or diaphoretic.  HENT:     Head: Normocephalic and atraumatic.     Right Ear: External ear normal.     Left Ear: External ear normal.     Nose: Nose normal.     Mouth/Throat:     Mouth: Mucous membranes are moist.     Pharynx: Oropharynx is clear.  Eyes:     General: No scleral icterus.    Extraocular Movements: Extraocular movements intact.     Pupils: Pupils are equal, round, and reactive to light.  Cardiovascular:     Rate and Rhythm: Normal rate and regular rhythm.     Heart sounds: Normal heart sounds. No  murmur. No friction rub. No gallop.   Pulmonary:     Effort: Pulmonary effort is normal. No respiratory distress.     Breath sounds: Normal breath sounds. No stridor. No wheezing, rhonchi or rales.  Skin:    General: Skin is warm and dry.  Neurological:     Mental Status: He is alert and oriented to person, place, and time.  Psychiatric:        Mood and Affect: Mood normal.        Behavior: Behavior normal.        Thought Content: Thought content normal.        Judgment: Judgment normal.     Results for orders placed or performed during the hospital encounter of 09/20/19 (from  the past 24 hour(s))  POC SARS Coronavirus 2 Ag-ED - Nasal Swab (BD Veritor Kit)     Status: None   Collection Time: 09/20/19  9:44 AM  Result Value Ref Range   SARS Coronavirus 2 Ag NEGATIVE NEGATIVE  POC SARS Coronavirus 2 Ag     Status: None   Collection Time: 09/20/19  9:44 AM  Result Value Ref Range   SARS Coronavirus 2 Ag NEGATIVE NEGATIVE  POC SARS Coronavirus 2 Ag-ED -     Status: None   Collection Time: 09/20/19  9:44 AM  Result Value Ref Range   SARS Coronavirus 2 Ag NEGATIVE NEGATIVE    Assessment and Plan :   1. Viral URI with cough   2. Atypical chest pain   3. Loose stools     Will manage for viral illness such as viral URI, viral rhinitis, possible COVID-19. Counseled patient on nature of COVID-19 including modes of transmission, diagnostic testing, management and supportive care.  Offered symptomatic relief. COVID 19 testing is pending. Counseled patient on potential for adverse effects with medications prescribed/recommended today, ER and return-to-clinic precautions discussed, patient verbalized understanding.     Jaynee Eagles, PA-C 09/20/19 1029

## 2019-09-22 ENCOUNTER — Telehealth (HOSPITAL_COMMUNITY): Payer: Self-pay | Admitting: Emergency Medicine

## 2019-09-22 LAB — NOVEL CORONAVIRUS, NAA (HOSP ORDER, SEND-OUT TO REF LAB; TAT 18-24 HRS): SARS-CoV-2, NAA: DETECTED — AB

## 2019-09-22 NOTE — Telephone Encounter (Signed)
Your test for COVID-19 was positive, meaning that you were infected with the novel coronavirus and could give the germ to others.  Please continue isolation at home for at least 10 days since the start of your symptoms. If you do not have symptoms, please isolate at home for 10 days from the day you were tested. Once you complete your 10 day quarantine, you may return to normal activities as long as you've not had a fever for over 24 hours(without taking fever reducing medicine) and your symptoms are improving. Please continue good preventive care measures, including:  frequent hand-washing, avoid touching your face, cover coughs/sneezes, stay out of crowds and keep a 6 foot distance from others.  Go to the nearest hospital emergency room if fever/cough/breathlessness are severe or illness seems like a threat to life.  Patient contacted by phone and made aware of    results. Pt verbalized understanding and had all questions answered.  Quarantine ends Jan 19th.

## 2019-09-26 ENCOUNTER — Telehealth (HOSPITAL_COMMUNITY): Payer: Self-pay | Admitting: Emergency Medicine

## 2019-09-26 NOTE — Telephone Encounter (Signed)
Spoke with pt regarding symptoms, he can return to work tomorrow, he meets CDC criteria to end isolation. Pt will come into office tomorrow to pick up documentation.

## 2019-11-14 ENCOUNTER — Encounter (HOSPITAL_COMMUNITY): Payer: Self-pay | Admitting: Emergency Medicine

## 2019-11-14 ENCOUNTER — Other Ambulatory Visit: Payer: Self-pay

## 2019-11-14 ENCOUNTER — Ambulatory Visit (HOSPITAL_COMMUNITY)
Admission: EM | Admit: 2019-11-14 | Discharge: 2019-11-14 | Disposition: A | Discharge status: Home / Self Care | Payer: HRSA Program | Attending: Urgent Care | Admitting: Urgent Care

## 2019-11-14 DIAGNOSIS — Z8616 Personal history of COVID-19: Secondary | ICD-10-CM | POA: Diagnosis present

## 2019-11-14 DIAGNOSIS — R519 Headache, unspecified: Secondary | ICD-10-CM | POA: Diagnosis present

## 2019-11-14 DIAGNOSIS — R52 Pain, unspecified: Secondary | ICD-10-CM | POA: Diagnosis present

## 2019-11-14 DIAGNOSIS — Z20822 Contact with and (suspected) exposure to covid-19: Secondary | ICD-10-CM | POA: Diagnosis present

## 2019-11-14 DIAGNOSIS — R059 Cough, unspecified: Secondary | ICD-10-CM

## 2019-11-14 DIAGNOSIS — R05 Cough: Secondary | ICD-10-CM | POA: Insufficient documentation

## 2019-11-14 MED ORDER — ONDANSETRON 8 MG PO TBDP: 8.0000 mg | ORAL_TABLET | Freq: Three times a day (TID) | ORAL | 0 refills | Status: DC | PRN

## 2019-11-14 MED ORDER — ONDANSETRON 4 MG PO TBDP
ORAL_TABLET | ORAL | Status: AC
  Filled 2019-11-14: qty 2

## 2019-11-14 MED ORDER — ONDANSETRON 4 MG PO TBDP
8.0000 mg | ORAL_TABLET | Freq: Once | ORAL | Status: AC
  Administered 2019-11-14: 8 mg via ORAL

## 2019-11-14 MED ORDER — ACETAMINOPHEN 325 MG PO TABS
650.0000 mg | ORAL_TABLET | Freq: Once | ORAL | Status: AC
  Administered 2019-11-14: 650 mg via ORAL

## 2019-11-14 MED ORDER — BENZONATATE 100 MG PO CAPS: 100.0000 mg | ORAL_CAPSULE | Freq: Three times a day (TID) | ORAL | 0 refills | Status: DC | PRN

## 2019-11-14 MED ORDER — PROMETHAZINE-DM 6.25-15 MG/5ML PO SYRP: 5.0000 mL | ORAL_SOLUTION | Freq: Every evening | ORAL | 0 refills | Status: DC | PRN

## 2019-11-14 MED ORDER — ACETAMINOPHEN 325 MG PO TABS
ORAL_TABLET | ORAL | Status: AC
  Filled 2019-11-14: qty 2

## 2019-11-14 NOTE — ED Provider Notes (Signed)
[facility]   MRN: [id number] DOB: 1966-02-20  Subjective:   Justin Middleton is a 54 y.o. male presenting for 1 day history of acute onset moderate malaise.  ROS as below. Patient's girlfriend has tested positive for COVID 19.  Patient tested positive for COVID-19 on 09/20/2019.  He is very worried about getting infected again given that he had a hard time with it last time.  He also knows family members struggled recovering as well.  No current facility-administered medications for this encounter.  Current Outpatient Medications:  .  benzonatate (TESSALON) 100 MG capsule, Take 1-2 capsules (100-200 mg total) by mouth 3 (three) times daily as needed for cough., Disp: 60 capsule, Rfl: 0 .  cyclobenzaprine (FLEXERIL) 5 MG tablet, Take 1 tablet (5 mg total) by mouth at bedtime., Disp: 15 tablet, Rfl: 0 .  naproxen (NAPROSYN) 500 MG tablet, Take 1 tablet (500 mg total) by mouth 2 (two) times daily., Disp: 30 tablet, Rfl: 0 .  promethazine-dextromethorphan (PROMETHAZINE-DM) 6.25-15 MG/5ML syrup, Take 5 mLs by mouth at bedtime as needed for cough., Disp: 100 mL, Rfl: 0 .  traMADol (ULTRAM) 50 MG tablet, Take 1 tablet (50 mg total) by mouth every 6 (six) hours as needed., Disp: 15 tablet, Rfl: 0   No Known Allergies  History reviewed. No pertinent past medical history.   History reviewed. No pertinent surgical history.  Family History  Problem Relation Age of Onset  . Diabetes Mother   . Healthy Father     Social History   Tobacco Use  . Smoking status: Never Smoker  . Smokeless tobacco: Never Used  Substance Use Topics  . Alcohol use: Yes    Comment: weekends  . Drug use: Never    Review of Systems  Constitutional: Positive for chills, diaphoresis and malaise/fatigue. Negative for fever.  HENT: Negative for congestion, ear pain, sinus pain and sore throat.   Eyes: Negative for discharge and redness.  Respiratory: Positive for cough. Negative for hemoptysis, shortness of  breath and wheezing.   Cardiovascular: Negative for chest pain.  Gastrointestinal: Positive for nausea. Negative for abdominal pain, diarrhea and vomiting.  Genitourinary: Negative for dysuria, flank pain and hematuria.  Musculoskeletal: Positive for joint pain and myalgias.  Skin: Negative for rash.  Neurological: Positive for headaches. Negative for dizziness and weakness.  Psychiatric/Behavioral: Negative for depression and substance abuse.     Objective:   Vitals: BP 124/90   Pulse 80   Temp 97.7 F (36.5 C) (Oral)   Resp 16   SpO2 98%   Wt Readings from Last 3 Encounters:  09/20/19 170 lb (77.1 kg)  09/06/19 165 lb (74.8 kg)  03/07/18 165 lb (74.8 kg)   Temp Readings from Last 3 Encounters:  11/14/19 97.7 F (36.5 C) (Oral)  09/20/19 99.7 F (37.6 C)  09/06/19 97.8 F (36.6 C) (Oral)   BP Readings from Last 3 Encounters:  11/14/19 124/90  09/20/19 (!) 147/76  09/06/19 (!) 189/81   Pulse Readings from Last 3 Encounters:  11/14/19 80  09/20/19 88  09/06/19 65   Physical Exam Constitutional:      General: He is not in acute distress.    Appearance: Normal appearance. He is well-developed. He is not ill-appearing, toxic-appearing or diaphoretic.  HENT:     Head: Normocephalic and atraumatic.     Right Ear: External ear normal.     Left Ear: External ear normal.     Nose: Nose normal.     Mouth/Throat:  Mouth: Mucous membranes are moist.     Pharynx: Oropharynx is clear.  Eyes:     General: No scleral icterus.    Extraocular Movements: Extraocular movements intact.     Pupils: Pupils are equal, round, and reactive to light.  Cardiovascular:     Rate and Rhythm: Normal rate and regular rhythm.     Heart sounds: Normal heart sounds. No murmur. No friction rub. No gallop.   Pulmonary:     Effort: Pulmonary effort is normal. No respiratory distress.     Breath sounds: Normal breath sounds. No stridor. No wheezing, rhonchi or rales.  Neurological:      Mental Status: He is alert and oriented to person, place, and time.  Psychiatric:        Mood and Affect: Mood normal.        Behavior: Behavior normal.        Thought Content: Thought content normal.      Assessment and Plan :   1. Cough   2. Generalized headaches   3. Body aches   4. History of COVID-19   5. Close exposure to COVID-19 virus     Counseled patient on possibility that if his Covid test is positive this time around it may not be new/reinfection.  Discussed that it may be a reflection of his previous infection still.  In any case, patient is to start supportive care.  Offered patient a note for work but he declined.  Physical exam findings of vital signs reassuring, held off on chest x-ray.  Counseled patient on potential for adverse effects with medications prescribed/recommended today, strict ER and return-to-clinic precautions discussed, patient verbalized understanding.    Jaynee Eagles, PA-C 11/14/19 1347

## 2019-11-14 NOTE — ED Triage Notes (Signed)
Tested postive for COVID 1/12. A clost contact tested positive yesterday and has been symptomatic all week. He began experiencing headache, body aches, cough, nausea last night.

## 2019-11-14 NOTE — Discharge Instructions (Signed)

## 2019-11-16 LAB — NOVEL CORONAVIRUS, NAA (HOSP ORDER, SEND-OUT TO REF LAB; TAT 18-24 HRS): SARS-CoV-2, NAA: NOT DETECTED

## 2019-12-19 ENCOUNTER — Encounter (HOSPITAL_COMMUNITY): Payer: Self-pay

## 2019-12-19 ENCOUNTER — Other Ambulatory Visit: Payer: Self-pay

## 2019-12-19 ENCOUNTER — Ambulatory Visit (HOSPITAL_COMMUNITY)
Admission: EM | Admit: 2019-12-19 | Discharge: 2019-12-19 | Disposition: A | Discharge status: Home / Self Care | Payer: Self-pay | Attending: Family Medicine | Admitting: Family Medicine

## 2019-12-19 DIAGNOSIS — R369 Urethral discharge, unspecified: Secondary | ICD-10-CM

## 2019-12-19 DIAGNOSIS — Z113 Encounter for screening for infections with a predominantly sexual mode of transmission: Secondary | ICD-10-CM

## 2019-12-19 MED ORDER — CEFTRIAXONE SODIUM 500 MG IJ SOLR
500.0000 mg | Freq: Once | INTRAMUSCULAR | Status: AC
  Administered 2019-12-19: 500 mg via INTRAMUSCULAR

## 2019-12-19 MED ORDER — AZITHROMYCIN 250 MG PO TABS
ORAL_TABLET | ORAL | Status: AC
  Filled 2019-12-19: qty 4

## 2019-12-19 MED ORDER — AZITHROMYCIN 250 MG PO TABS
1000.0000 mg | ORAL_TABLET | Freq: Once | ORAL | Status: AC
  Administered 2019-12-19: 1000 mg via ORAL

## 2019-12-19 MED ORDER — CEFTRIAXONE SODIUM 500 MG IJ SOLR
INTRAMUSCULAR | Status: AC
  Filled 2019-12-19: qty 500

## 2019-12-19 MED ORDER — LIDOCAINE HCL (PF) 1 % IJ SOLN
INTRAMUSCULAR | Status: AC
  Filled 2019-12-19: qty 2

## 2019-12-19 NOTE — ED Provider Notes (Signed)
[facility]    CSN: [id number] Arrival date & time: 12/19/19  1247      History   Chief Complaint Chief Complaint  Patient presents with  . SEXUALLY TRANSMITTED DISEASE    HPI Justin Middleton is a 54 y.o. male.   Patient reports that he has been experiencing penile discharge for the last 2 weeks.  Reports that 2 weeks ago the discharge was clear.  Reports that it has changed to green/yellow discharge with some discomfort.  Has not been instructed that someone he was having intercourse with had a confirmed STD.  Denies condom use.  Denies headache, shortness of breath, cough, nausea, vomiting, diarrhea, abdominal pain, chills, body aches, rash, fever, other symptoms.  ROS per HPI   The history is provided by the patient.    History reviewed. No pertinent past medical history.  There are no problems to display for this patient.   History reviewed. No pertinent surgical history.     Home Medications    Prior to Admission medications   Medication Sig Start Date End Date Taking? Authorizing Provider  benzonatate (TESSALON) 100 MG capsule Take 1-2 capsules (100-200 mg total) by mouth 3 (three) times daily as needed for cough. 11/14/19   Jaynee Eagles, PA-C  cyclobenzaprine (FLEXERIL) 5 MG tablet Take 1 tablet (5 mg total) by mouth at bedtime. 05/13/19   Zigmund Gottron, NP  naproxen (NAPROSYN) 500 MG tablet Take 1 tablet (500 mg total) by mouth 2 (two) times daily. 05/13/19   Zigmund Gottron, NP  ondansetron (ZOFRAN-ODT) 8 MG disintegrating tablet Take 1 tablet (8 mg total) by mouth every 8 (eight) hours as needed for nausea or vomiting. 11/14/19   Jaynee Eagles, PA-C  promethazine-dextromethorphan (PROMETHAZINE-DM) 6.25-15 MG/5ML syrup Take 5 mLs by mouth at bedtime as needed for cough. 11/14/19   Jaynee Eagles, PA-C  traMADol (ULTRAM) 50 MG tablet Take 1 tablet (50 mg total) by mouth every 6 (six) hours as needed. 09/06/19   Vanessa Kick, MD    Family History Family History    Problem Relation Age of Onset  . Diabetes Mother   . Healthy Father     Social History Social History   Tobacco Use  . Smoking status: Never Smoker  . Smokeless tobacco: Never Used  Substance Use Topics  . Alcohol use: Yes    Comment: weekends  . Drug use: Never     Allergies   Patient has no known allergies.   Review of Systems Review of Systems   Physical Exam Triage Vital Signs ED Triage Vitals  Enc Vitals Group     BP 12/19/19 1419 (!) 143/76     Pulse Rate 12/19/19 1419 66     Resp 12/19/19 1419 14     Temp 12/19/19 1419 99 F (37.2 C)     Temp Source 12/19/19 1419 Oral     SpO2 12/19/19 1419 100 %     Weight --      Height --      Head Circumference --      Peak Flow --      Pain Score 12/19/19 1417 0     Pain Loc --      Pain Edu? --      Excl. in [place]? --    No data found.  Updated Vital Signs BP (!) 143/76 (BP Location: Right Arm)   Pulse 66   Temp 99 F (37.2 C) (Oral)   Resp 14   SpO2  100%   Visual Acuity Right Eye Distance:   Left Eye Distance:   Bilateral Distance:    Right Eye Near:   Left Eye Near:    Bilateral Near:     Physical Exam Vitals and nursing note reviewed.  Constitutional:      General: He is not in acute distress.    Appearance: Normal appearance. He is well-developed and normal weight. He is not ill-appearing.  HENT:     Head: Normocephalic and atraumatic.     Nose: Nose normal.     Mouth/Throat:     Mouth: Mucous membranes are moist.     Pharynx: Oropharynx is clear.  Eyes:     Extraocular Movements: Extraocular movements intact.     Conjunctiva/sclera: Conjunctivae normal.     Pupils: Pupils are equal, round, and reactive to light.  Cardiovascular:     Rate and Rhythm: Normal rate and regular rhythm.     Heart sounds: No murmur.  Pulmonary:     Effort: Pulmonary effort is normal. No respiratory distress.     Breath sounds: Normal breath sounds.  Abdominal:     General: Bowel sounds are normal.      Palpations: Abdomen is soft.     Tenderness: There is no abdominal tenderness.  Genitourinary:    Penis: Discharge (Yellow-green in color) present.   Musculoskeletal:        General: Normal range of motion.     Cervical back: Normal range of motion and neck supple.  Lymphadenopathy:     Cervical: No cervical adenopathy.  Skin:    General: Skin is warm and dry.     Capillary Refill: Capillary refill takes less than 2 seconds.  Neurological:     General: No focal deficit present.     Mental Status: He is alert and oriented to person, place, and time.  Psychiatric:        Mood and Affect: Mood normal.        Behavior: Behavior normal.        Thought Content: Thought content normal.      UC Treatments / Results  Labs (all labs ordered are listed, but only abnormal results are displayed) Labs Reviewed  CYTOLOGY, (ORAL, ANAL, URETHRAL) ANCILLARY ONLY    EKG   Radiology No results found.  Procedures Procedures (including critical care time)  Medications Ordered in UC Medications  cefTRIAXone (ROCEPHIN) injection 500 mg (500 mg Intramuscular Given 12/19/19 1500)  azithromycin (ZITHROMAX) tablet 1,000 mg (1,000 mg Oral Given 12/19/19 1500)    Initial Impression / Assessment and Plan / UC Course  I have reviewed the triage vital signs and the nursing notes.  Pertinent labs & imaging results that were available during my care of the patient were reviewed by me and considered in my medical decision making (see chart for details).     Penile discharge: Has been occurring x2 weeks.  Patient reports that it has gotten worse as he is having sex more often with his current partner.  Discharge is yellow-green in color, swab obtained in office for testing for gonorrhea, chlamydia, trichomoniasis.  We will go ahead and treat with Rocephin and 1 g azithromycin in office today.  Patient instructed that his results will be available via MyChart.  If he needs further treatment, we will also  contacted with him know.  Patient instructed that if he is not feeling better over the next few days, to follow-up with this office.  Instructed to not have sex  for the next 7 days or until treatment is completed.  Patient verbalizes understanding and agrees with treatment plan. Final Clinical Impressions(s) / UC Diagnoses   Final diagnoses:  Screen for STD (sexually transmitted disease)  Penile discharge     Discharge Instructions     You were treated with an antibiotic today called Rocephin.  You were also given Zithromax 1000mg   Do not have sex for 7 days.  Your STD tests are pending.  If your test results are positive, we will call you.  You may need additional treatment and your partner(s) may also need treatment.         ED Prescriptions    None     PDMP not reviewed this encounter.   , NP 12/19/19 1529

## 2019-12-19 NOTE — Discharge Instructions (Addendum)
You were treated with an antibiotic today called Rocephin.  You were also given Zithromax 1000mg   Do not have sex for 7 days.  Your STD tests are pending.  If your test results are positive, we will call you.  You may need additional treatment and your partner(s) may also need treatment.

## 2019-12-19 NOTE — ED Triage Notes (Signed)
C/o penile discharge x1 day. Previous sexual partner also contacted him to let him know that they are also experiencing symptoms.

## 2019-12-20 ENCOUNTER — Telehealth (HOSPITAL_COMMUNITY): Payer: Self-pay

## 2019-12-20 LAB — CYTOLOGY, (ORAL, ANAL, URETHRAL) ANCILLARY ONLY
Chlamydia: NEGATIVE
Comment: NEGATIVE
Comment: NEGATIVE
Comment: NORMAL
Neisseria Gonorrhea: NEGATIVE
Trichomonas: POSITIVE — AB

## 2019-12-21 ENCOUNTER — Ambulatory Visit (HOSPITAL_COMMUNITY)
Admission: EM | Admit: 2019-12-21 | Discharge: 2019-12-21 | Discharge status: Against Medical Advice | Payer: Self-pay | Attending: Emergency Medicine | Admitting: Emergency Medicine

## 2019-12-21 ENCOUNTER — Other Ambulatory Visit: Payer: Self-pay

## 2020-01-10 ENCOUNTER — Ambulatory Visit: Payer: Medicaid Other | Attending: Internal Medicine

## 2020-01-10 DIAGNOSIS — Z23 Encounter for immunization: Secondary | ICD-10-CM

## 2020-01-10 MED ORDER — METRONIDAZOLE 500 MG PO TABS: 2000.0000 mg | ORAL_TABLET | Freq: Once | ORAL | 0 refills | Status: AC

## 2020-01-10 NOTE — Telephone Encounter (Signed)
Patient called requesting medication to be sent to the pharmacy.  He was positive for trichomonas.  Metronidazole 2 g p.o. in a single dose was sent to the pharmacy.

## 2020-01-10 NOTE — Progress Notes (Signed)
   Covid-19 Vaccination Clinic  Name:  Justin Middleton    MRN: [id number] DOB: July 14, 1966  01/10/2020  [name] was observed post Covid-19 immunization for 15 minutes without incident. He was provided with Vaccine Information Sheet and instruction to access the V-Safe system.   [name] was instructed to call 911 with any severe reactions post vaccine: Marland Kitchen Difficulty breathing  . Swelling of face and throat  . A fast heartbeat  . A bad rash all over body  . Dizziness and weakness   Immunizations Administered    Name Date Dose VIS Date Route   Pfizer COVID-19 Vaccine 01/10/2020  4:38 PM 0.3 mL 11/02/2018 Intramuscular   Manufacturer: [employer], [employer]   Lot: [id number]   NDC: [address]-9

## 2020-01-24 ENCOUNTER — Emergency Department (HOSPITAL_COMMUNITY)
Admission: EM | Admit: 2020-01-24 | Discharge: 2020-01-24 | Disposition: A | Discharge status: Home / Self Care | Payer: Medicaid Other | Attending: Emergency Medicine | Admitting: Emergency Medicine

## 2020-01-24 ENCOUNTER — Emergency Department (HOSPITAL_COMMUNITY): Payer: Medicaid Other

## 2020-01-24 DIAGNOSIS — R079 Chest pain, unspecified: Secondary | ICD-10-CM | POA: Insufficient documentation

## 2020-01-24 DIAGNOSIS — Z5321 Procedure and treatment not carried out due to patient leaving prior to being seen by health care provider: Secondary | ICD-10-CM | POA: Insufficient documentation

## 2020-01-24 LAB — CBC
HCT: 43.6 % (ref 39.0–52.0)
Hemoglobin: 14 g/dL (ref 13.0–17.0)
MCH: 28.2 pg (ref 26.0–34.0)
MCHC: 32.1 g/dL (ref 30.0–36.0)
MCV: 87.9 fL (ref 80.0–100.0)
Platelets: 296 10*3/uL (ref 150–400)
RBC: 4.96 MIL/uL (ref 4.22–5.81)
RDW: 13 % (ref 11.5–15.5)
WBC: 5.3 10*3/uL (ref 4.0–10.5)
nRBC: 0 % (ref 0.0–0.2)

## 2020-01-24 LAB — BASIC METABOLIC PANEL
Anion gap: 16 — ABNORMAL HIGH (ref 5–15)
BUN: 15 mg/dL (ref 6–20)
CO2: 19 mmol/L — ABNORMAL LOW (ref 22–32)
Calcium: 9.4 mg/dL (ref 8.9–10.3)
Chloride: 103 mmol/L (ref 98–111)
Creatinine, Ser: 1.19 mg/dL (ref 0.61–1.24)
GFR calc Af Amer: >60 mL/min (ref >60)
GFR calc non Af Amer: >60 mL/min (ref >60)
Glucose, Bld: 130 mg/dL — ABNORMAL HIGH (ref 70–99)
Potassium: 3.9 mmol/L (ref 3.5–5.1)
Sodium: 138 mmol/L (ref 135–145)

## 2020-01-24 LAB — TROPONIN I (HIGH SENSITIVITY): Troponin I (High Sensitivity): 3 ng/L (ref <18)

## 2020-01-24 NOTE — ED Notes (Signed)
Amber called X3 no answer  

## 2020-01-24 NOTE — ED Triage Notes (Signed)
Pt endorses left sided chest pain. States he took a percocet this morning. Pt is covered in water and shaking, not wanting to answer questions.

## 2020-02-07 ENCOUNTER — Ambulatory Visit: Payer: Medicaid Other | Attending: Internal Medicine

## 2020-02-07 DIAGNOSIS — Z23 Encounter for immunization: Secondary | ICD-10-CM

## 2020-02-07 NOTE — Progress Notes (Signed)
° °  Covid-19 Vaccination Clinic  Name:  Justin Middleton    MRN: [id number] DOB: 1965/11/05  02/07/2020  Mr. Ingram was observed post Covid-19 immunization for 15 minutes without incident. He was provided with Vaccine Information Sheet and instruction to access the V-Safe system.   Mr. Schleyer was instructed to call 911 with any severe reactions post vaccine:  Difficulty breathing   Swelling of face and throat   A fast heartbeat   A bad rash all over body   Dizziness and weakness   Immunizations Administered    Name Date Dose VIS Date Route   Pfizer COVID-19 Vaccine 02/07/2020  4:56 PM 0.3 mL 11/02/2018 Intramuscular   Manufacturer: [employer], [employer]   Lot: [id number]   NDC: [address]-2

## 2020-05-30 ENCOUNTER — Encounter (HOSPITAL_COMMUNITY): Payer: Self-pay | Admitting: Emergency Medicine

## 2020-05-30 ENCOUNTER — Other Ambulatory Visit: Payer: Self-pay

## 2020-05-30 ENCOUNTER — Ambulatory Visit (HOSPITAL_COMMUNITY)
Admission: EM | Admit: 2020-05-30 | Discharge: 2020-05-30 | Disposition: A | Discharge status: Home / Self Care | Payer: Self-pay | Attending: Family Medicine | Admitting: Family Medicine

## 2020-05-30 DIAGNOSIS — H1031 Unspecified acute conjunctivitis, right eye: Secondary | ICD-10-CM

## 2020-05-30 DIAGNOSIS — J069 Acute upper respiratory infection, unspecified: Secondary | ICD-10-CM

## 2020-05-30 MED ORDER — FLUTICASONE PROPIONATE 50 MCG/ACT NA SUSP: 1.0000 | Freq: Every day | NASAL | 2 refills | Status: DC

## 2020-05-30 MED ORDER — BENZONATATE 100 MG PO CAPS: 100.0000 mg | ORAL_CAPSULE | Freq: Three times a day (TID) | ORAL | 0 refills | Status: AC | PRN

## 2020-05-30 MED ORDER — POLYMYXIN B-TRIMETHOPRIM 10000-0.1 UNIT/ML-% OP SOLN: 1.0000 [drp] | OPHTHALMIC | 0 refills | Status: AC

## 2020-05-30 NOTE — Discharge Instructions (Signed)
Your COVID-19 test is in process at this time. Please use Polytrim eyedrops for bacterial conjunctivitis of the right eye. You have been prescribed Tessalon Perles for cough. You have been prescribed Flonase for nasal congestion.

## 2020-05-30 NOTE — ED Notes (Signed)
Patient reluctant to get covid test, will talk to provider

## 2020-05-30 NOTE — ED Triage Notes (Signed)
Days ago noticed cold sweats.  Today woke with crusty drainage around right eye, eye is red, sinus congestion/stuffy

## 2020-05-30 NOTE — ED Provider Notes (Signed)
Emergency Department Provider Note  ____________________________________________  Time seen: Approximately 8:22 PM  I have reviewed the triage vital signs and the nursing notes.   HISTORY  Chief Complaint URI   Historian Patient    HPI Justin Middleton is a 54 y.o. male presents to the emergency department with matting and crusting of the right eye as well as a gritty sensation.  He has also had sporadic cough, nasal congestion and woke up and sweats for the past 2 days.  He denies persistent cough, increased work of breathing, chest pain, chest tightness or abdominal pain.  He has been working out in a gym recently and has numerous potential sick contacts.  To his knowledge, he has been afebrile at home.  No other alleviating measures have been attempted.   History reviewed. No pertinent past medical history.   Immunizations up to date:  Yes.     History reviewed. No pertinent past medical history.  There are no problems to display for this patient.   History reviewed. No pertinent surgical history.  Prior to Admission medications   Medication Sig Start Date End Date Taking? Authorizing Provider  benzonatate (TESSALON PERLES) 100 MG capsule Take 1 capsule (100 mg total) by mouth 3 (three) times daily as needed for up to 7 days for cough. 05/30/20 06/06/20  Orvil Feil, PA-C  cyclobenzaprine (FLEXERIL) 5 MG tablet Take 1 tablet (5 mg total) by mouth at bedtime. 05/13/19   Georgetta Haber, NP  fluticasone (FLONASE) 50 MCG/ACT nasal spray Place 1 spray into both nostrils daily for 7 days. 05/30/20 06/06/20  Orvil Feil, PA-C  naproxen (NAPROSYN) 500 MG tablet Take 1 tablet (500 mg total) by mouth 2 (two) times daily. 05/13/19   Georgetta Haber, NP  ondansetron (ZOFRAN-ODT) 8 MG disintegrating tablet Take 1 tablet (8 mg total) by mouth every 8 (eight) hours as needed for nausea or vomiting. 11/14/19   Wallis Bamberg, PA-C  promethazine-dextromethorphan (PROMETHAZINE-DM) 6.25-15  MG/5ML syrup Take 5 mLs by mouth at bedtime as needed for cough. 11/14/19   Wallis Bamberg, PA-C  traMADol (ULTRAM) 50 MG tablet Take 1 tablet (50 mg total) by mouth every 6 (six) hours as needed. 09/06/19   Mardella Layman, MD  trimethoprim-polymyxin b (POLYTRIM) ophthalmic solution Place 1 drop into the right eye every 4 (four) hours for 7 days. 05/30/20 06/06/20  Orvil Feil, PA-C    Allergies Patient has no known allergies.  Family History  Problem Relation Age of Onset  . Diabetes Mother   . Healthy Father     Social History Social History   Tobacco Use  . Smoking status: Never Smoker  . Smokeless tobacco: Never Used  Vaping Use  . Vaping Use: Never used  Substance Use Topics  . Alcohol use: Yes    Comment: weekends  . Drug use: Never     Review of Systems  Constitutional: No fever/chills Eyes: Patient has right eye conjunctivitis. ENT: Patient has nasal congestion.  Respiratory: Patient has sporadic cough.  Gastrointestinal:   No nausea, no vomiting.  No diarrhea.  No constipation. Musculoskeletal: Negative for musculoskeletal pain. Skin: Negative for rash, abrasions, lacerations, ecchymosis.    ____________________________________________   PHYSICAL EXAM:  VITAL SIGNS: ED Triage Vitals  Enc Vitals Group     BP 05/30/20 1946 138/83     Pulse Rate 05/30/20 1946 66     Resp 05/30/20 1946 18     Temp 05/30/20 1946 98.7 F (37.1 C)  Temp Source 05/30/20 1946 Oral     SpO2 05/30/20 1946 97 %     Weight --      Height --      Head Circumference --      Peak Flow --      Pain Score 05/30/20 1943 0     Pain Loc --      Pain Edu? --      Excl. in [place]? --      Constitutional: Alert and oriented. Well appearing and in no acute distress. Eyes: Patient has crusting visualized in the medial and lateral canthi of the right eye.  Right eye conjunctivitis visualized. Head: Atraumatic. ENT:      Ears: TMs are pearly.      Nose: No congestion/rhinnorhea.       Mouth/Throat: Mucous membranes are moist.  Neck: No stridor.  No cervical spine tenderness to palpation. Cardiovascular: Normal rate, regular rhythm. Normal S1 and S2.  Good peripheral circulation. Respiratory: Normal respiratory effort without tachypnea or retractions. Lungs CTAB. Good air entry to the bases with no decreased or absent breath sounds Gastrointestinal: Bowel sounds x 4 quadrants. Soft and nontender to palpation. No guarding or rigidity. No distention. Musculoskeletal: Full range of motion to all extremities. No obvious deformities noted Neurologic:  Normal for age. No gross focal neurologic deficits are appreciated.  Skin:  Skin is warm, dry and intact. No rash noted. Psychiatric: Mood and affect are normal for age. Speech and behavior are normal.   ____________________________________________   LABS (all labs ordered are listed, but only abnormal results are displayed)  Labs Reviewed - No data to display ____________________________________________  EKG   ____________________________________________  RADIOLOGY   No results found.  ____________________________________________    PROCEDURES  Procedure(s) performed:     Procedures     Medications - No data to display   ____________________________________________   INITIAL IMPRESSION / ASSESSMENT AND PLAN / ED COURSE  Pertinent labs & imaging results that were available during my care of the patient were reviewed by me and considered in my medical decision making (see chart for details).      Assessment and plan Conjunctivitis Cough Nasal congestion 54 year old male presents to the emergency department with right eye conjunctivitis, sporadic cough, nasal congestion and sweats for the past 2 days.  Vital signs were reassuring at triage.  On physical exam, patient was resting comfortably with no increased work of breathing.  Patient had right eye conjunctivitis with crusting visualized at the  medial and lateral canthi.  Patient opted to undergo COVID-19 testing despite being positive earlier this year.  Patient was discharged home with Polytrim ophthalmic solution.  I cautioned patient that conjunctivitis could be viral but I am suspicious for a bacterial source as current symptoms are unilateral.  Return precautions were given to return with new or worsening symptoms.  All patient questions were answered.    ____________________________________________  FINAL CLINICAL IMPRESSION(S) / ED DIAGNOSES  Final diagnoses:  Viral upper respiratory tract infection  Acute bacterial conjunctivitis of right eye      NEW MEDICATIONS STARTED DURING THIS VISIT:  ED Discharge Orders         Ordered    trimethoprim-polymyxin b (POLYTRIM) ophthalmic solution  Every 4 hours        05/30/20 2015    benzonatate (TESSALON PERLES) 100 MG capsule  3 times daily PRN        05/30/20 2015    fluticasone (FLONASE) 50 MCG/ACT nasal spray  Daily        05/30/20 2015              This chart was dictated using voice recognition software/Dragon. Despite best efforts to proofread, errors can occur which can change the meaning. Any change was purely unintentional.     Orvil Feil, PA-C 05/30/20 2026

## 2020-05-31 ENCOUNTER — Ambulatory Visit (HOSPITAL_COMMUNITY)
Admission: EM | Admit: 2020-05-31 | Discharge: 2020-05-31 | Disposition: A | Discharge status: Home / Self Care | Payer: Medicaid Other | Attending: Emergency Medicine | Admitting: Emergency Medicine

## 2020-05-31 DIAGNOSIS — Z01812 Encounter for preprocedural laboratory examination: Secondary | ICD-10-CM | POA: Diagnosis present

## 2020-05-31 DIAGNOSIS — Z20822 Contact with and (suspected) exposure to covid-19: Secondary | ICD-10-CM | POA: Insufficient documentation

## 2020-05-31 LAB — SARS CORONAVIRUS 2 (TAT 6-24 HRS): SARS Coronavirus 2: NEGATIVE

## 2020-05-31 NOTE — ED Notes (Signed)
Patient reentered to place COVID swab, was not ordered during visit on 05/30/20

## 2020-08-29 ENCOUNTER — Other Ambulatory Visit: Payer: Self-pay

## 2020-08-29 ENCOUNTER — Ambulatory Visit (HOSPITAL_COMMUNITY)
Admission: EM | Admit: 2020-08-29 | Discharge: 2020-08-29 | Disposition: A | Discharge status: Home / Self Care | Payer: Medicaid Other | Attending: Emergency Medicine | Admitting: Emergency Medicine

## 2020-08-29 ENCOUNTER — Encounter (HOSPITAL_COMMUNITY): Payer: Self-pay

## 2020-08-29 DIAGNOSIS — Z202 Contact with and (suspected) exposure to infections with a predominantly sexual mode of transmission: Secondary | ICD-10-CM | POA: Insufficient documentation

## 2020-08-29 DIAGNOSIS — R109 Unspecified abdominal pain: Secondary | ICD-10-CM

## 2020-08-29 DIAGNOSIS — R140 Abdominal distension (gaseous): Secondary | ICD-10-CM | POA: Insufficient documentation

## 2020-08-29 DIAGNOSIS — R197 Diarrhea, unspecified: Secondary | ICD-10-CM | POA: Insufficient documentation

## 2020-08-29 LAB — CBC
HCT: 44.9 % (ref 39.0–52.0)
Hemoglobin: 14.5 g/dL (ref 13.0–17.0)
MCH: 27.8 pg (ref 26.0–34.0)
MCHC: 32.3 g/dL (ref 30.0–36.0)
MCV: 86 fL (ref 80.0–100.0)
Platelets: 261 10*3/uL (ref 150–400)
RBC: 5.22 MIL/uL (ref 4.22–5.81)
RDW: 13.2 % (ref 11.5–15.5)
WBC: 4.8 10*3/uL (ref 4.0–10.5)
nRBC: 0 % (ref 0.0–0.2)

## 2020-08-29 LAB — POC OCCULT BLOOD, ED: Fecal Occult Bld: NEGATIVE

## 2020-08-29 LAB — HIV ANTIBODY (ROUTINE TESTING W REFLEX): HIV Screen 4th Generation wRfx: NONREACTIVE

## 2020-08-29 MED ORDER — FAMOTIDINE 20 MG PO TABS
ORAL_TABLET | ORAL | Status: AC
  Filled 2020-08-29: qty 1

## 2020-08-29 MED ORDER — FAMOTIDINE 20 MG PO TABS
20.0000 mg | ORAL_TABLET | Freq: Once | ORAL | Status: AC
  Administered 2020-08-29: 17:00:00 20 mg via ORAL

## 2020-08-29 MED ORDER — PANTOPRAZOLE SODIUM 20 MG PO TBEC: 20.0000 mg | DELAYED_RELEASE_TABLET | Freq: Every day | ORAL | 0 refills | Status: DC

## 2020-08-29 MED ORDER — ALUM & MAG HYDROXIDE-SIMETH 200-200-20 MG/5ML PO SUSP
30.0000 mL | Freq: Once | ORAL | Status: AC
  Administered 2020-08-29: 17:00:00 30 mL via ORAL

## 2020-08-29 MED ORDER — ALUM & MAG HYDROXIDE-SIMETH 200-200-20 MG/5ML PO SUSP
ORAL | Status: AC
  Filled 2020-08-29: qty 30

## 2020-08-29 NOTE — ED Triage Notes (Signed)
Pt presents diarrhea and feeling bloated x 1 week. States stools are black.    Pt requested STD's testing. States a sexual partner had an STD 3-4 month ago, he was not tested as he did not have any symptoms. Pt is concerned the blister in the tip of the tongue is from an STD's/

## 2020-08-29 NOTE — Discharge Instructions (Addendum)
There is no blood in your stool today which is reassuring. I am checking your blood level to make sure you have not been bleeding in excess.  Avoid any antiinflammatory medications like naproxen or ibuprofen.  Drink plenty of water. Pepto-bismol can certainly cause black to your stool.  See provided information about diet related to diarrhea.  Start daily protonix.  We are screening you for stds, we will call you if any positive test findings and if you need any further treatment.  Use condoms to prevent stds.  Go to the ER for any worsening of symptoms.  Follow up with GI if persistent.

## 2020-08-29 NOTE — ED Provider Notes (Signed)
[facility]    CSN: [id number] Arrival date & time: 08/29/20  1352      History   Chief Complaint Chief Complaint  Patient presents with  . Abdominal Pain  . Exposure to STD    HPI Justin Middleton is a 54 y.o. male.   Justin Middleton presents with complaints of black colored stool which is looser and more frequent than usual for him, abdominal bloating and "bubbling." initially started weeks ago with looser stools and discomfort. Noted it black in color over the past few days. Recently started taking pepto bismol but states that it was black in color before starting this. No nausea or vomiting. No specific abdominal pain. States he had similar episode once before which resolved on its own. No previous colonoscopy. No family history of colon cancer. Today has felt some dizziness. States he does binge drink alcohol on the weekends but doesn't drink nightly. No fevers. No recent travel. No known ill contacts. Also with concern about potential std exposure. He sometimes experiences a blood blister to the tip of his tongue. No ulcerations. No penile symptoms. No dysuria.    ROS per HPI, negative if not otherwise mentioned.      History reviewed. No pertinent past medical history.  There are no problems to display for this patient.   History reviewed. No pertinent surgical history.     Home Medications    Prior to Admission medications   Medication Sig Start Date End Date Taking? Authorizing Provider  cyclobenzaprine (FLEXERIL) 5 MG tablet Take 1 tablet (5 mg total) by mouth at bedtime. 05/13/19   Georgetta Haber, NP  fluticasone (FLONASE) 50 MCG/ACT nasal spray Place 1 spray into both nostrils daily for 7 days. 05/30/20 06/06/20  Orvil Feil, PA-C  naproxen (NAPROSYN) 500 MG tablet Take 1 tablet (500 mg total) by mouth 2 (two) times daily. 05/13/19   Georgetta Haber, NP  ondansetron (ZOFRAN-ODT) 8 MG disintegrating tablet Take 1 tablet (8 mg total) by mouth every 8  (eight) hours as needed for nausea or vomiting. 11/14/19   Wallis Bamberg, PA-C  pantoprazole (PROTONIX) 20 MG tablet Take 1 tablet (20 mg total) by mouth daily. 08/29/20   Georgetta Haber, NP  promethazine-dextromethorphan (PROMETHAZINE-DM) 6.25-15 MG/5ML syrup Take 5 mLs by mouth at bedtime as needed for cough. 11/14/19   Wallis Bamberg, PA-C  traMADol (ULTRAM) 50 MG tablet Take 1 tablet (50 mg total) by mouth every 6 (six) hours as needed. 09/06/19   Mardella Layman, MD    Family History Family History  Problem Relation Age of Onset  . Diabetes Mother   . Healthy Father     Social History Social History   Tobacco Use  . Smoking status: Never Smoker  . Smokeless tobacco: Never Used  Vaping Use  . Vaping Use: Never used  Substance Use Topics  . Alcohol use: Yes    Comment: weekends  . Drug use: Never     Allergies   Patient has no known allergies.   Review of Systems Review of Systems   Physical Exam Triage Vital Signs ED Triage Vitals  Enc Vitals Group     BP 08/29/20 1545 140/79     Pulse Rate 08/29/20 1545 61     Resp 08/29/20 1545 17     Temp 08/29/20 1545 98 F (36.7 C)     Temp Source 08/29/20 1545 Oral     SpO2 08/29/20 1545 95 %     Weight --  Height --      Head Circumference --      Peak Flow --      Pain Score 08/29/20 1543 0     Pain Loc --      Pain Edu? --      Excl. in GC? --    No data found.  Updated Vital Signs BP 140/79 (BP Location: Left Arm)   Pulse 61   Temp 98 F (36.7 C) (Oral)   Resp 17   SpO2 95%    Physical Exam Constitutional:      Appearance: He is well-developed.  HENT:     Mouth/Throat:     Comments: No oral lesions now  Cardiovascular:     Rate and Rhythm: Normal rate.  Pulmonary:     Effort: Pulmonary effort is normal.  Abdominal:     Tenderness: There is no abdominal tenderness.  Genitourinary:    Comments: Stool sample in clinic is soft stool, no liquid or loose; black in color; negative occult; gi panel  deferred Skin:    General: Skin is warm and dry.  Neurological:     Mental Status: He is alert and oriented to person, place, and time.      UC Treatments / Results  Labs (all labs ordered are listed, but only abnormal results are displayed) Labs Reviewed  CBC  RPR  HIV ANTIBODY (ROUTINE TESTING W REFLEX)  OCCULT BLOOD X 1 CARD TO LAB, STOOL  CYTOLOGY, (ORAL, ANAL, URETHRAL) ANCILLARY ONLY    EKG   Radiology No results found.  Procedures Procedures (including critical care time)  Medications Ordered in UC Medications  alum & mag hydroxide-simeth (MAALOX/MYLANTA) 200-200-20 MG/5ML suspension 30 mL (has no administration in time range)  famotidine (PEPCID) tablet 20 mg (has no administration in time range)    Initial Impression / Assessment and Plan / UC Course  I have reviewed the triage vital signs and the nursing notes.  Pertinent labs & imaging results that were available during my care of the patient were reviewed by me and considered in my medical decision making (see chart for details).     Negative occult here today, suspect pepto bismol likely contributing to color of stools. Cbc to confirm. protonix as well. Follow up and return precautions discussed. Std screening in process. Patient verbalized understanding and agreeable to plan.   Final Clinical Impressions(s) / UC Diagnoses   Final diagnoses:  Diarrhea, unspecified type  Abdominal bloating  Possible exposure to STD     Discharge Instructions     I am checking your blood level to make sure you have not been bleeding in excess.  Avoid any antiinflammatory medications like naproxen or ibuprofen.  Drink plenty of water. Pepto-bismol can certainly cause black to your stool.  See provided information about diet related to diarrhea.  Start daily protonix.  We are screening you for stds, we will call you if any positive test findings and if you need any further treatment.  Use condoms to prevent stds.   Go to the ER for any worsening of symptoms.  Follow up with GI if persistent.    ED Prescriptions    Medication Sig Dispense Auth. Provider   pantoprazole (PROTONIX) 20 MG tablet Take 1 tablet (20 mg total) by mouth daily. 30 tablet Georgetta Haber, NP     PDMP not reviewed this encounter.   Georgetta Haber, NP 08/29/20 1650

## 2020-08-30 ENCOUNTER — Emergency Department (HOSPITAL_COMMUNITY): Payer: Self-pay

## 2020-08-30 ENCOUNTER — Inpatient Hospital Stay (HOSPITAL_COMMUNITY)
Admission: EM | Admit: 2020-08-30 | Discharge: 2020-09-01 | DRG: 917 | Disposition: A | Discharge status: Home / Self Care | Payer: Self-pay | Attending: Internal Medicine | Admitting: Internal Medicine

## 2020-08-30 ENCOUNTER — Other Ambulatory Visit: Payer: Self-pay

## 2020-08-30 DIAGNOSIS — E861 Hypovolemia: Secondary | ICD-10-CM | POA: Diagnosis present

## 2020-08-30 DIAGNOSIS — E860 Dehydration: Secondary | ICD-10-CM | POA: Diagnosis present

## 2020-08-30 DIAGNOSIS — Y905 Blood alcohol level of 100-119 mg/100 ml: Secondary | ICD-10-CM | POA: Diagnosis present

## 2020-08-30 DIAGNOSIS — G9341 Metabolic encephalopathy: Secondary | ICD-10-CM | POA: Diagnosis present

## 2020-08-30 DIAGNOSIS — Y9259 Other trade areas as the place of occurrence of the external cause: Secondary | ICD-10-CM

## 2020-08-30 DIAGNOSIS — T405X1A Poisoning by cocaine, accidental (unintentional), initial encounter: Principal | ICD-10-CM | POA: Diagnosis present

## 2020-08-30 DIAGNOSIS — T40601A Poisoning by unspecified narcotics, accidental (unintentional), initial encounter: Secondary | ICD-10-CM | POA: Diagnosis present

## 2020-08-30 DIAGNOSIS — G9340 Encephalopathy, unspecified: Secondary | ICD-10-CM | POA: Diagnosis present

## 2020-08-30 DIAGNOSIS — J9600 Acute respiratory failure, unspecified whether with hypoxia or hypercapnia: Secondary | ICD-10-CM | POA: Diagnosis present

## 2020-08-30 DIAGNOSIS — N179 Acute kidney failure, unspecified: Secondary | ICD-10-CM | POA: Diagnosis present

## 2020-08-30 DIAGNOSIS — R739 Hyperglycemia, unspecified: Secondary | ICD-10-CM | POA: Diagnosis present

## 2020-08-30 DIAGNOSIS — T50901A Poisoning by unspecified drugs, medicaments and biological substances, accidental (unintentional), initial encounter: Secondary | ICD-10-CM

## 2020-08-30 DIAGNOSIS — Z20822 Contact with and (suspected) exposure to covid-19: Secondary | ICD-10-CM | POA: Diagnosis present

## 2020-08-30 DIAGNOSIS — E876 Hypokalemia: Secondary | ICD-10-CM | POA: Diagnosis present

## 2020-08-30 DIAGNOSIS — E872 Acidosis: Secondary | ICD-10-CM | POA: Diagnosis present

## 2020-08-30 HISTORY — DX: Other psychoactive substance abuse, uncomplicated: F19.10

## 2020-08-30 HISTORY — DX: Other specified health status: Z78.9

## 2020-08-30 LAB — CBC WITH DIFFERENTIAL/PLATELET
Abs Immature Granulocytes: 0.03 10*3/uL (ref 0.00–0.07)
Basophils Absolute: 0 10*3/uL (ref 0.0–0.1)
Basophils Relative: 0 %
Eosinophils Absolute: 0.1 10*3/uL (ref 0.0–0.5)
Eosinophils Relative: 1 %
HCT: 43.9 % (ref 39.0–52.0)
Hemoglobin: 13.6 g/dL (ref 13.0–17.0)
Immature Granulocytes: 1 %
Lymphocytes Relative: 34 %
Lymphs Abs: 1.9 10*3/uL (ref 0.7–4.0)
MCH: 27.9 pg (ref 26.0–34.0)
MCHC: 31 g/dL (ref 30.0–36.0)
MCV: 90 fL (ref 80.0–100.0)
Monocytes Absolute: 0.5 10*3/uL (ref 0.1–1.0)
Monocytes Relative: 8 %
Neutro Abs: 3.2 10*3/uL (ref 1.7–7.7)
Neutrophils Relative %: 56 %
Platelets: 270 10*3/uL (ref 150–400)
RBC: 4.88 MIL/uL (ref 4.22–5.81)
RDW: 13.2 % (ref 11.5–15.5)
WBC: 5.7 10*3/uL (ref 4.0–10.5)
nRBC: 0 % (ref 0.0–0.2)

## 2020-08-30 LAB — CYTOLOGY, (ORAL, ANAL, URETHRAL) ANCILLARY ONLY
Chlamydia: NEGATIVE
Comment: NEGATIVE
Comment: NEGATIVE
Comment: NORMAL
Neisseria Gonorrhea: NEGATIVE
Trichomonas: NEGATIVE

## 2020-08-30 LAB — CBG MONITORING, ED: Glucose-Capillary: 233 mg/dL — ABNORMAL HIGH (ref 70–99)

## 2020-08-30 LAB — RPR: RPR Ser Ql: NONREACTIVE

## 2020-08-30 MED ORDER — NALOXONE HCL 4 MG/10ML IJ SOLN
2.0000 mg/h | INTRAVENOUS | Status: DC
  Administered 2020-08-30 – 2020-08-31 (×3): 2 mg/h via INTRAVENOUS
  Filled 2020-08-30 (×6): qty 10

## 2020-08-30 NOTE — ED Provider Notes (Signed)
[facility] EMERGENCY DEPARTMENT Provider Note   CSN: [id number] Arrival date & time: 08/30/20  2321     History Chief Complaint  Patient presents with  . Drug Overdose   Level 5 caveat due to altered mental status Justin Middleton is a 54 y.o. male.  The history is provided by the EMS personnel. The history is limited by the condition of the patient.  Drug Overdose This is a new problem. The problem occurs constantly. The problem has not changed since onset.Nothing aggravates the symptoms.  Patient presents after presumed drug overdose.  Police report the patient and another person were found unresponsive on the first floor of an apartment building.  Both patients were given Narcan.  This patient started to breathe some spontaneously, but did receive CPR prior to improvement Patient received to 4 mg of Narcan. No signs of trauma reported     PMH-unknown Soc hx - unknown Family History  Problem Relation Age of Onset  . Diabetes Mother   . Healthy Father     Social History   Tobacco Use  . Smoking status: Never Smoker  . Smokeless tobacco: Never Used  Vaping Use  . Vaping Use: Never used  Substance Use Topics  . Alcohol use: Yes    Comment: weekends  . Drug use: Never    Home Medications Prior to Admission medications   Medication Sig Start Date End Date Taking? Authorizing Provider  cyclobenzaprine (FLEXERIL) 5 MG tablet Take 1 tablet (5 mg total) by mouth at bedtime. 05/13/19   Georgetta Haber, NP  fluticasone (FLONASE) 50 MCG/ACT nasal spray Place 1 spray into both nostrils daily for 7 days. 05/30/20 06/06/20  Orvil Feil, PA-C  naproxen (NAPROSYN) 500 MG tablet Take 1 tablet (500 mg total) by mouth 2 (two) times daily. 05/13/19   Georgetta Haber, NP  ondansetron (ZOFRAN-ODT) 8 MG disintegrating tablet Take 1 tablet (8 mg total) by mouth every 8 (eight) hours as needed for nausea or vomiting. 11/14/19   Wallis Bamberg, PA-C  pantoprazole (PROTONIX) 20  MG tablet Take 1 tablet (20 mg total) by mouth daily. 08/29/20   Georgetta Haber, NP  promethazine-dextromethorphan (PROMETHAZINE-DM) 6.25-15 MG/5ML syrup Take 5 mLs by mouth at bedtime as needed for cough. 11/14/19   Wallis Bamberg, PA-C  traMADol (ULTRAM) 50 MG tablet Take 1 tablet (50 mg total) by mouth every 6 (six) hours as needed. 09/06/19   Mardella Layman, MD    Allergies    Patient has no known allergies.  Review of Systems   Review of Systems  Unable to perform ROS: Mental status change    Physical Exam Updated Vital Signs There were no vitals taken for this visit.  Physical Exam CONSTITUTIONAL: Ill-appearing, unresponsive HEAD: Normocephalic/atraumatic, no signs of trauma EYES: Pupils pinpoint ENMT: Mucous membranes moist NECK: supple no meningeal signs SPINE/BACK: No bruising/crepitance/stepoffs noted to spine CV: S1/S2 noted, no murmurs/rubs/gallops noted LUNGS: Lungs are clear to auscultation bilaterally, no apparent distress ABDOMEN: soft, nontender NEURO: Pt is unresponsive.  No response to voice or pain EXTREMITIES: pulses normal/equal, no deformities SKIN: warm, color normal PSYCH: Unable to assess  ED Results / Procedures / Treatments   Labs (all labs ordered are listed, but only abnormal results are displayed) Labs Reviewed  COMPREHENSIVE METABOLIC PANEL - Abnormal; Notable for the following components:      Result Value   Potassium 2.8 (*)    CO2 19 (*)    Glucose, Bld 240 (*)  Creatinine, Ser 1.42 (*)    Calcium 8.6 (*)    AST 81 (*)    ALT 92 (*)    GFR, Estimated 59 (*)    Anion gap 18 (*)    All other components within normal limits  SALICYLATE LEVEL - Abnormal; Notable for the following components:   Salicylate Lvl <7.0 (*)    All other components within normal limits  ACETAMINOPHEN LEVEL - Abnormal; Notable for the following components:   Acetaminophen (Tylenol), Serum <10 (*)    All other components within normal limits  ETHANOL -  Abnormal; Notable for the following components:   Alcohol, Ethyl (B) 112 (*)    All other components within normal limits  RAPID URINE DRUG SCREEN, HOSP PERFORMED - Abnormal; Notable for the following components:   Cocaine POSITIVE (*)    Amphetamines POSITIVE (*)    All other components within normal limits  CBG MONITORING, ED - Abnormal; Notable for the following components:   Glucose-Capillary 233 (*)    All other components within normal limits  RESP PANEL BY RT-PCR (FLU A&B, COVID) ARPGX2  CBC WITH DIFFERENTIAL/PLATELET  CBC  BASIC METABOLIC PANEL    EKG EKG Interpretation  Date/Time:  Thursday August 30 2020 23:25:07 EST Ventricular Rate:  89 PR Interval:    QRS Duration: 99 QT Interval:  372 QTC Calculation: 453 R Axis:   59 Text Interpretation: Sinus rhythm LAE, consider biatrial enlargement RSR' in V1 or V2, probably normal variant Borderline T abnormalities, inferior leads No significant change since last tracing Confirmed by Zadie Rhine ([id number]) on 08/31/2020 12:58:38 AM   Radiology DG Chest Port 1 View  Result Date: 08/30/2020 CLINICAL DATA:  Drug overdose EXAM: PORTABLE CHEST 1 VIEW COMPARISON:  Radiograph 01/24/2020 FINDINGS: Low lung volumes with some vascular crowding and atelectatic opacities. More patchy opacity in the retrocardiac space and medial right lung base could reflect further atelectasis or sequela of aspiration in the setting of altered mental status. No pneumothorax. No effusion. Cardiomediastinal contours are unremarkable. No acute osseous or soft tissue abnormality. Telemetry leads overlie the chest. IMPRESSION: Low lung volumes with some vascular crowding and atelectatic opacities. More patchy opacity in the retrocardiac space and medial right lung base could reflect further atelectasis or sequela of aspiration in the setting of altered mental status/overdose. Electronically Signed   By: Kreg Shropshire M.D.   On: 08/30/2020 23:49     Procedures .Critical Care Performed by: Zadie Rhine, MD Authorized by: Zadie Rhine, MD   Critical care provider statement:    Critical care time (minutes):  60   Critical care start time:  08/31/2020 12:02 AM   Critical care end time:  08/31/2020 1:02 AM   Critical care time was exclusive of:  Separately billable procedures and treating other patients   Critical care was necessary to treat or prevent imminent or life-threatening deterioration of the following conditions:  Respiratory failure   Critical care was time spent personally by me on the following activities:  Examination of patient, evaluation of patient's response to treatment, pulse oximetry, ordering and review of laboratory studies, ordering and review of radiographic studies, ordering and performing treatments and interventions, re-evaluation of patient's condition, discussions with consultants and obtaining history from patient or surrogate   I assumed direction of critical care for this patient from another provider in my specialty: no     Care discussed with: admitting provider      Medications Ordered in ED Medications  naloxone HCl (NARCAN) 4  mg in dextrose 5 % 250 mL infusion (2 mg/hr Intravenous New Bag/Given 08/30/20 2352)    ED Course  I have reviewed the triage vital signs and the nursing notes.  Pertinent labs & imaging results that were available during my care of the patient were reviewed by me and considered in my medical decision making (see chart for details).    MDM Rules/Calculators/A&P                          12:02 AM Patient arrives via EMS after presumed overdose.  Patient had some improvement after 4 mg of Narcan with respirations.  On my exam patient had minimal response to pain or voice.  He is 100% on nonrebreather protecting his airway.  However his respirations are starting to decrease, Narcan drip has been ordered. No signs of trauma is noted 3:03 AM Patient stabilized on  Narcan drip.  Patient is somnolent but will follow commands and can move all 4 extremities.  He still has nonrebreather in place. Discussed with Dr. Julian Reil for admission, he will be placed in the stepdown unit as he requires a Narcan drip. Labs reveal polysubstance abuse. He is also dehydrated with hypokalemia. Final Clinical Impression(s) / ED Diagnoses Final diagnoses:  Accidental drug overdose, initial encounter  Acute respiratory failure, unspecified whether with hypoxia or hypercapnia (HCC)  Dehydration  Hypokalemia    Rx / DC Orders ED Discharge Orders    None       Zadie Rhine, MD 08/31/20 [phone]

## 2020-08-30 NOTE — ED Triage Notes (Signed)
Pt bib squad for overdose. Per squad pt found on floor in apartment building. [employer] started cpr but was stopped when squad arrive with cpr. Patient has pinpoint pupils, 4 of narcan with minimal response. Patient cbg was 226.

## 2020-08-31 ENCOUNTER — Other Ambulatory Visit: Payer: Self-pay

## 2020-08-31 ENCOUNTER — Encounter (HOSPITAL_COMMUNITY): Payer: Self-pay | Admitting: Internal Medicine

## 2020-08-31 DIAGNOSIS — G9340 Encephalopathy, unspecified: Secondary | ICD-10-CM | POA: Diagnosis present

## 2020-08-31 DIAGNOSIS — T40601A Poisoning by unspecified narcotics, accidental (unintentional), initial encounter: Secondary | ICD-10-CM | POA: Diagnosis present

## 2020-08-31 DIAGNOSIS — N179 Acute kidney failure, unspecified: Secondary | ICD-10-CM

## 2020-08-31 DIAGNOSIS — E8729 Other acidosis: Secondary | ICD-10-CM | POA: Insufficient documentation

## 2020-08-31 DIAGNOSIS — E876 Hypokalemia: Secondary | ICD-10-CM

## 2020-08-31 DIAGNOSIS — E872 Acidosis: Secondary | ICD-10-CM

## 2020-08-31 DIAGNOSIS — T50901A Poisoning by unspecified drugs, medicaments and biological substances, accidental (unintentional), initial encounter: Secondary | ICD-10-CM

## 2020-08-31 LAB — RAPID URINE DRUG SCREEN, HOSP PERFORMED
Amphetamines: POSITIVE — AB
Barbiturates: NOT DETECTED
Benzodiazepines: NOT DETECTED
Cocaine: POSITIVE — AB
Opiates: NOT DETECTED
Tetrahydrocannabinol: NOT DETECTED

## 2020-08-31 LAB — BASIC METABOLIC PANEL
Anion gap: 11 (ref 5–15)
BUN: 10 mg/dL (ref 6–20)
CO2: 22 mmol/L (ref 22–32)
Calcium: 9.2 mg/dL (ref 8.9–10.3)
Chloride: 101 mmol/L (ref 98–111)
Creatinine, Ser: 0.96 mg/dL (ref 0.61–1.24)
GFR, Estimated: >60 mL/min (ref >60)
Glucose, Bld: 115 mg/dL — ABNORMAL HIGH (ref 70–99)
Potassium: 4.3 mmol/L (ref 3.5–5.1)
Sodium: 134 mmol/L — ABNORMAL LOW (ref 135–145)

## 2020-08-31 LAB — RESP PANEL BY RT-PCR (FLU A&B, COVID) ARPGX2
Influenza A by PCR: NEGATIVE
Influenza B by PCR: NEGATIVE
SARS Coronavirus 2 by RT PCR: NEGATIVE

## 2020-08-31 LAB — HEMOGLOBIN A1C
Hgb A1c MFr Bld: 5 % (ref 4.8–5.6)
Mean Plasma Glucose: 96.8 mg/dL

## 2020-08-31 LAB — COMPREHENSIVE METABOLIC PANEL
ALT: 92 U/L — ABNORMAL HIGH (ref 0–44)
AST: 81 U/L — ABNORMAL HIGH (ref 15–41)
Albumin: 3.9 g/dL (ref 3.5–5.0)
Alkaline Phosphatase: 41 U/L (ref 38–126)
Anion gap: 18 — ABNORMAL HIGH (ref 5–15)
BUN: 12 mg/dL (ref 6–20)
CO2: 19 mmol/L — ABNORMAL LOW (ref 22–32)
Calcium: 8.6 mg/dL — ABNORMAL LOW (ref 8.9–10.3)
Chloride: 99 mmol/L (ref 98–111)
Creatinine, Ser: 1.42 mg/dL — ABNORMAL HIGH (ref 0.61–1.24)
GFR, Estimated: 59 mL/min — ABNORMAL LOW (ref >60)
Glucose, Bld: 240 mg/dL — ABNORMAL HIGH (ref 70–99)
Potassium: 2.8 mmol/L — ABNORMAL LOW (ref 3.5–5.1)
Sodium: 136 mmol/L (ref 135–145)
Total Bilirubin: 1 mg/dL (ref 0.3–1.2)
Total Protein: 7.2 g/dL (ref 6.5–8.1)

## 2020-08-31 LAB — I-STAT ARTERIAL BLOOD GAS, ED
Acid-Base Excess: 1 mmol/L (ref 0.0–2.0)
Bicarbonate: 27.6 mmol/L (ref 20.0–28.0)
Calcium, Ion: 1.22 mmol/L (ref 1.15–1.40)
HCT: 40 % (ref 39.0–52.0)
Hemoglobin: 13.6 g/dL (ref 13.0–17.0)
O2 Saturation: 100 %
Patient temperature: 98.9
Potassium: 4.2 mmol/L (ref 3.5–5.1)
Sodium: 134 mmol/L — ABNORMAL LOW (ref 135–145)
TCO2: 29 mmol/L (ref 22–32)
pCO2 arterial: 49.8 mmHg — ABNORMAL HIGH (ref 32.0–48.0)
pH, Arterial: 7.352 (ref 7.350–7.450)
pO2, Arterial: 402 mmHg — ABNORMAL HIGH (ref 83.0–108.0)

## 2020-08-31 LAB — CBC
HCT: 42.5 % (ref 39.0–52.0)
Hemoglobin: 13.3 g/dL (ref 13.0–17.0)
MCH: 27.8 pg (ref 26.0–34.0)
MCHC: 31.3 g/dL (ref 30.0–36.0)
MCV: 88.9 fL (ref 80.0–100.0)
Platelets: 218 10*3/uL (ref 150–400)
RBC: 4.78 MIL/uL (ref 4.22–5.81)
RDW: 13.4 % (ref 11.5–15.5)
WBC: 10.3 10*3/uL (ref 4.0–10.5)
nRBC: 0 % (ref 0.0–0.2)

## 2020-08-31 LAB — CBG MONITORING, ED
Glucose-Capillary: 107 mg/dL — ABNORMAL HIGH (ref 70–99)
Glucose-Capillary: 110 mg/dL — ABNORMAL HIGH (ref 70–99)

## 2020-08-31 LAB — ETHANOL: Alcohol, Ethyl (B): 112 mg/dL — ABNORMAL HIGH (ref <10)

## 2020-08-31 LAB — ACETAMINOPHEN LEVEL: Acetaminophen (Tylenol), Serum: <10 ug/mL — ABNORMAL LOW (ref 10–30)

## 2020-08-31 LAB — TROPONIN I (HIGH SENSITIVITY): Troponin I (High Sensitivity): 12 ng/L (ref <18)

## 2020-08-31 LAB — GLUCOSE, CAPILLARY
Glucose-Capillary: 104 mg/dL — ABNORMAL HIGH (ref 70–99)
Glucose-Capillary: 91 mg/dL (ref 70–99)

## 2020-08-31 LAB — SALICYLATE LEVEL: Salicylate Lvl: <7 mg/dL — ABNORMAL LOW (ref 7.0–30.0)

## 2020-08-31 LAB — MRSA PCR SCREENING: MRSA by PCR: NEGATIVE

## 2020-08-31 LAB — MAGNESIUM: Magnesium: 1.6 mg/dL — ABNORMAL LOW (ref 1.7–2.4)

## 2020-08-31 MED ORDER — LACTATED RINGERS IV BOLUS
1000.0000 mL | Freq: Once | INTRAVENOUS | Status: AC
  Administered 2020-08-31: 01:00:00 1000 mL via INTRAVENOUS

## 2020-08-31 MED ORDER — INSULIN ASPART 100 UNIT/ML ~~LOC~~ SOLN: 0.0000 [IU] | SUBCUTANEOUS | Status: DC

## 2020-08-31 MED ORDER — CHLORHEXIDINE GLUCONATE 0.12 % MT SOLN
15.0000 mL | Freq: Two times a day (BID) | OROMUCOSAL | Status: DC
  Administered 2020-08-31 – 2020-09-01 (×3): 15 mL via OROMUCOSAL
  Filled 2020-08-31 (×3): qty 15

## 2020-08-31 MED ORDER — ONDANSETRON HCL 4 MG PO TABS: 4.0000 mg | ORAL_TABLET | Freq: Four times a day (QID) | ORAL | Status: DC | PRN

## 2020-08-31 MED ORDER — ACETAMINOPHEN 650 MG RE SUPP: 650.0000 mg | Freq: Four times a day (QID) | RECTAL | Status: DC | PRN

## 2020-08-31 MED ORDER — ACETAMINOPHEN 325 MG PO TABS: 650.0000 mg | ORAL_TABLET | Freq: Four times a day (QID) | ORAL | Status: DC | PRN

## 2020-08-31 MED ORDER — POTASSIUM CHLORIDE 10 MEQ/100ML IV SOLN
10.0000 meq | INTRAVENOUS | Status: AC
  Administered 2020-08-31 (×4): 10 meq via INTRAVENOUS
  Filled 2020-08-31 (×3): qty 100

## 2020-08-31 MED ORDER — ENOXAPARIN SODIUM 40 MG/0.4ML ~~LOC~~ SOLN
40.0000 mg | SUBCUTANEOUS | Status: DC
  Administered 2020-08-31 – 2020-09-01 (×2): 40 mg via SUBCUTANEOUS
  Filled 2020-08-31 (×2): qty 0.4

## 2020-08-31 MED ORDER — ORAL CARE MOUTH RINSE
15.0000 mL | Freq: Two times a day (BID) | OROMUCOSAL | Status: DC
  Administered 2020-08-31: 11:00:00 15 mL via OROMUCOSAL

## 2020-08-31 MED ORDER — ONDANSETRON HCL 4 MG/2ML IJ SOLN: 4.0000 mg | Freq: Four times a day (QID) | INTRAMUSCULAR | Status: DC | PRN

## 2020-08-31 MED ORDER — LACTATED RINGERS IV SOLN: INTRAVENOUS | Status: DC

## 2020-08-31 NOTE — ED Notes (Signed)
Called lab and requested for lab add on- magnesium and hemoglobin a1c.

## 2020-08-31 NOTE — Progress Notes (Signed)
PCCM progress note  Patient examined on arrival in ICU Somnolent but arousable.  Stable vital status On Narcan drip at 2  Labs and ABG reviewed which are unremarkable  Wean off Narcan drip Transfer out of ICU when stable off drip  Consuela Widener MD [facility] Please see [email or web address] for pager details.  08/31/2020, 12:17 PM

## 2020-08-31 NOTE — Progress Notes (Signed)
Report given to 5M RN.  

## 2020-08-31 NOTE — H&P (Signed)
NAMEMarcia Middleton, MRN:  [id number], DOB:  Sep 22, 1965, LOS: 0 ADMISSION DATE:  08/30/2020, CONSULTATION DATE:  08/31/2020 REFERRING MD:  Dr. Julian Reil, [facility], CHIEF COMPLAINT:  Encephalopath   Brief History:  54 yo male found unresponsive on floor in apartment building.  Had CPR by [employer], but stopped when EMS arrived since he had a pulse and had brief improvement in mental status after receiving narcan.  UDS positive for cocaine, amphetamines.  Also had elevated blood alcohol level.  Started on narcan gtt.  PCCM asked to assess for ICU admission.  Hx from chart and medical team.  Pt not able to provide hx.  Past Medical History:  Polysubstance abuse, Allergies  Significant Hospital Events:  12/24 admit, start on narcan gtt  Consults:    Procedures:    Significant Diagnostic Tests:    Micro Data:  COVID/flu 12/23 >> negative  Antimicrobials:     Interim History / Subjective:    Objective   Blood pressure 132/83, pulse 78, temperature 98.9 F (37.2 C), temperature source Oral, resp. rate 17, height 5\' 7"  (1.702 m), weight 77.1 kg, SpO2 100 %.       No intake or output data in the 24 hours ending 08/31/20 0431 Filed Weights   08/31/20 0015  Weight: 77.1 kg    Examination:  General - somnolent, wearing NRB mask Eyes - pupils reactive ENT - no sinus tenderness, no stridor, nasal trumpet in place Cardiac - regular rate/rhythm, no murmur Chest - decreased breath sounds, no wheeze Abdomen - soft, non tender, + bowel sounds Extremities - no cyanosis, clubbing, or edema Skin - no rashes Neuro - wakes up with brisk stimulation, follows simple commands and is able to state his name but then falls back to sleep quickly   Resolved Hospital Problem list     Assessment & Plan:   Acute metabolic encephalopathy. - presumably from accidental overdose of opiate medication not detected with UDS, cocaine, amphetamine, and alcohol - needs monitoring of mental status and  respiratory status in ICU - f/u ABG - continue narcan gtt  Anion gap metabolic acidosis. - f/u ABG  Hypokalemia. - f/u BMET, Mg  AKI from hypovolemia. - baseline creatinine 1.19 from 01/24/20 - continue IV fluids - monitor renal fx, urine outpt  Hyperglycemia. - no prior hx of DM  - SSI - check HbA1C   Best practice (evaluated daily)  Diet: NPO Pain/Anxiety/Delirium protocol (if indicated): N/A VAP protocol (if indicated): N/A DVT prophylaxis: Lovenox GI prophylaxis: N/A Glucose control: SSI Mobility: bed rest Disposition: ICU  Goals of Care:  Last date of multidisciplinary goals of care discussion: Pending Family and staff present: Pending Summary of discussion: Pending Follow up goals of care discussion due: Pending Code Status: Full code  Labs   CBC: Recent Labs  Lab 08/29/20 1614 08/30/20 2325  WBC 4.8 5.7  NEUTROABS  --  3.2  HGB 14.5 13.6  HCT 44.9 43.9  MCV 86.0 90.0  PLT 261 270    Basic Metabolic Panel: Recent Labs  Lab 08/30/20 2325  NA 136  K 2.8*  CL 99  CO2 19*  GLUCOSE 240*  BUN 12  CREATININE 1.42*  CALCIUM 8.6*   GFR: Estimated Creatinine Clearance: 56.2 mL/min (A) (by C-G formula based on SCr of 1.42 mg/dL (H)). Recent Labs  Lab 08/29/20 1614 08/30/20 2325  WBC 4.8 5.7    Liver Function Tests: Recent Labs  Lab 08/30/20 2325  AST 81*  ALT 92*  ALKPHOS  41  BILITOT 1.0  PROT 7.2  ALBUMIN 3.9   No results for input(s): LIPASE, AMYLASE in the last 168 hours. No results for input(s): AMMONIA in the last 168 hours.  ABG No results found for: PHART, PCO2ART, PO2ART, HCO3, TCO2, ACIDBASEDEF, O2SAT   Coagulation Profile: No results for input(s): INR, PROTIME in the last 168 hours.  Cardiac Enzymes: No results for input(s): CKTOTAL, CKMB, CKMBINDEX, TROPONINI in the last 168 hours.  HbA1C: No results found for: HGBA1C  CBG: Recent Labs  Lab 08/30/20 2349  GLUCAP 233*    Review of Systems:   Unable to  obtain  Past Medical History:  He,  has a past medical history of Polysubstance abuse (HCC).   Surgical History:  History reviewed. No pertinent surgical history.   Social History:   reports that he has never smoked. He has never used smokeless tobacco. He reports current alcohol use. He reports current drug use. Drugs: Amphetamines, Cocaine, and Fentanyl.   Family History:  His family history includes Diabetes in his mother; Healthy in his father.   Allergies No Known Allergies   Home Medications  Prior to Admission medications   Medication Sig Start Date End Date Taking? Authorizing Provider  loratadine (CLARITIN) 10 MG tablet Take 10 mg by mouth daily as needed for allergies.   Yes [provider]  pantoprazole (PROTONIX) 20 MG tablet Take 1 tablet (20 mg total) by mouth daily. 08/29/20  Yes Burky, Dorene Grebe B, NP  tadalafil (CIALIS) 5 MG tablet Take 5 mg by mouth daily. 08/24/20  Yes [provider]     Critical care time: 33 minutes  Coralyn Helling, MD [facility] Pulmonary/Critical Care Pager - [phone] 08/31/2020, 4:48 AM

## 2020-08-31 NOTE — Progress Notes (Signed)
Pt arrived to 2H14 via ED stretcher accompanied by Clydie Braun, Charity fundraiser. Pt arrived A&XO4, room air. Pt placed on tele monitor, continuous pulse ox. Continuous narcan drip infusing per order. Pt denies any pain or discomfort, verbalizes no needs at this time.

## 2020-08-31 NOTE — Progress Notes (Signed)
Report attempted to 5M. 

## 2020-08-31 NOTE — Plan of Care (Signed)

## 2020-08-31 NOTE — ED Notes (Addendum)
Stuck pt.x3 twotime on left side one on right hasnd  no blood return notified nurse.

## 2020-08-31 NOTE — ED Notes (Signed)
Dr. Alinda Money was paged for [facility]. However did not call out prior to leaving at 2am. Dr. Julian Reil is being paged now to complete consult.

## 2020-08-31 NOTE — ED Notes (Signed)
Mother-- Elease Hashimoto  -- [phone] Brother - Tyrone -- [phone]

## 2020-08-31 NOTE — H&P (Addendum)
History and Physical    Justin Middleton PJA:[id number] DOB: June 09, 1966 DOA: 08/30/2020  PCP: Patient, No Pcp Per  Patient coming from: Home  I have personally briefly reviewed patient's old medical records in [facility]  Chief Complaint: OD  HPI: Justin Middleton is a 54 y.o. male with medical history significant of polysubstance abuse.  Pt presents to ED after presumed drug OD.  Police report that both pt and another person were found unresponsive on the first floor of an apartment building.  Both patients given narcan.  Pt started to breathe spontaneously but did receive CPR prior to improvement.  4mg  narcan total given in field.   ED Course: In ED, pt put on narcan gtt.  Has had improvement in mental status.  Now wakes up to voice, answers questions.  Denies pain anywhere.  BAL 112, UDS positive for amphetamines and cocaine.   Review of Systems: As per HPI, otherwise all review of systems negative.  Past Medical History:  Diagnosis Date  . Polysubstance abuse (HCC)     History reviewed. No pertinent surgical history.   reports that he has never smoked. He has never used smokeless tobacco. He reports current alcohol use. He reports that he does not use drugs.  No Known Allergies  Family History  Problem Relation Age of Onset  . Diabetes Mother   . Healthy Father      Prior to Admission medications   Medication Sig Start Date End Date Taking? Authorizing Provider  loratadine (CLARITIN) 10 MG tablet Take 10 mg by mouth daily as needed for allergies.   Yes [provider]  pantoprazole (PROTONIX) 20 MG tablet Take 1 tablet (20 mg total) by mouth daily. 08/29/20  Yes Burky, 08/31/20 B, NP  tadalafil (CIALIS) 5 MG tablet Take 5 mg by mouth daily. 08/24/20  Yes [provider]    Physical Exam: Vitals:   08/31/20 0045 08/31/20 0100 08/31/20 0115 08/31/20 0130  BP: 121/73 130/68 129/74 134/77  Pulse: 84 84 86 84  Resp: 12 (!) 9 14 11   Temp:       TempSrc:      SpO2: 100% 100% 100% 100%  Weight:      Height:        Constitutional: NAD, calm, comfortable, opens eyes to voice, answers questions Eyes: PERRL, lids and conjunctivae normal ENMT: Mucous membranes are moist. Posterior pharynx clear of any exudate or lesions.Normal dentition.  Neck: normal, supple, no masses, no thyromegaly Respiratory: clear to auscultation bilaterally, no wheezing, no crackles. Normal respiratory effort. No accessory muscle use.  Cardiovascular: Regular rate and rhythm, no murmurs / rubs / gallops. No extremity edema. 2+ pedal pulses. No carotid bruits.  Abdomen: no tenderness, no masses palpated. No hepatosplenomegaly. Bowel sounds positive.  Musculoskeletal: no clubbing / cyanosis. No joint deformity upper and lower extremities. Good ROM, no contractures. Normal muscle tone.  Skin: no rashes, lesions, ulcers. No induration Neurologic: CN 2-12 grossly intact. Sensation intact, DTR normal. Strength 5/5 in all 4.  Psychiatric: Normal judgment and insight. Alert and oriented x 3. Normal mood.    Labs on Admission: I have personally reviewed following labs and imaging studies  CBC: Recent Labs  Lab 08/29/20 1614 08/30/20 2325  WBC 4.8 5.7  NEUTROABS  --  3.2  HGB 14.5 13.6  HCT 44.9 43.9  MCV 86.0 90.0  PLT 261 270   Basic Metabolic Panel: Recent Labs  Lab 08/30/20 2325  NA 136  K 2.8*  CL 99  CO2 19*  GLUCOSE 240*  BUN 12  CREATININE 1.42*  CALCIUM 8.6*   GFR: Estimated Creatinine Clearance: 56.2 mL/min (A) (by C-G formula based on SCr of 1.42 mg/dL (H)). Liver Function Tests: Recent Labs  Lab 08/30/20 2325  AST 81*  ALT 92*  ALKPHOS 41  BILITOT 1.0  PROT 7.2  ALBUMIN 3.9   No results for input(s): LIPASE, AMYLASE in the last 168 hours. No results for input(s): AMMONIA in the last 168 hours. Coagulation Profile: No results for input(s): INR, PROTIME in the last 168 hours. Cardiac Enzymes: No results for input(s):  CKTOTAL, CKMB, CKMBINDEX, TROPONINI in the last 168 hours. BNP (last 3 results) No results for input(s): PROBNP in the last 8760 hours. HbA1C: No results for input(s): HGBA1C in the last 72 hours. CBG: Recent Labs  Lab 08/30/20 2349  GLUCAP 233*   Lipid Profile: No results for input(s): CHOL, HDL, LDLCALC, TRIG, CHOLHDL, LDLDIRECT in the last 72 hours. Thyroid Function Tests: No results for input(s): TSH, T4TOTAL, FREET4, T3FREE, THYROIDAB in the last 72 hours. Anemia Panel: No results for input(s): VITAMINB12, FOLATE, FERRITIN, TIBC, IRON, RETICCTPCT in the last 72 hours. Urine analysis:    Component Value Date/Time   COLORURINE STRAW (A) 05/22/2017 1430   APPEARANCEUR CLEAR 05/22/2017 1430   LABSPEC 1.004 (L) 05/22/2017 1430   PHURINE 6.0 05/22/2017 1430   GLUCOSEU NEGATIVE 05/22/2017 1430   HGBUR NEGATIVE 05/22/2017 1430   BILIRUBINUR NEGATIVE 05/22/2017 1430   KETONESUR NEGATIVE 05/22/2017 1430   PROTEINUR NEGATIVE 05/22/2017 1430   NITRITE NEGATIVE 05/22/2017 1430   LEUKOCYTESUR NEGATIVE 05/22/2017 1430    Radiological Exams on Admission: DG Chest Port 1 View  Result Date: 08/30/2020 CLINICAL DATA:  Drug overdose EXAM: PORTABLE CHEST 1 VIEW COMPARISON:  Radiograph 01/24/2020 FINDINGS: Low lung volumes with some vascular crowding and atelectatic opacities. More patchy opacity in the retrocardiac space and medial right lung base could reflect further atelectasis or sequela of aspiration in the setting of altered mental status. No pneumothorax. No effusion. Cardiomediastinal contours are unremarkable. No acute osseous or soft tissue abnormality. Telemetry leads overlie the chest. IMPRESSION: Low lung volumes with some vascular crowding and atelectatic opacities. More patchy opacity in the retrocardiac space and medial right lung base could reflect further atelectasis or sequela of aspiration in the setting of altered mental status/overdose. Electronically Signed   By: Kreg Shropshire M.D.   On: 08/30/2020 23:49    EKG: Independently reviewed.  Assessment/Plan Principal Problem:   Overdose of opiate or related narcotic (HCC)    1. OD of opiate - 1. Given response to narcan, initial apnea, neg UDS, suspect fentanyl or methadone which wouldn't show up on UDS. 2. Pt stabilized and mental status improved on narcan gtt, continue this for now. 3. Tele monitor 4. Repeat AM labs  DVT prophylaxis: Lovenox Code Status: Full Family Communication: No family in room Disposition Plan: Home after able to be weaned off narcan gtt Consults called: Will give PCCM a call since he is on narcan gtt and technically needs ICU status Admission status: Place in obs  Justin Middleton M. DO [facility]  How to contact the [facility] Attending or Consulting provider 7A - 7P or covering provider during after hours 7P -7A, for this patient?  1. Check the care team in [facility] and look for a) attending/consulting [facility] provider listed and b) the [facility] team listed 2. Log into [email or web address]  Amion Physician Scheduling and messaging for groups and whole hospitals  On call and physician scheduling software for group practices, residents, hospitalists and other medical providers for call, clinic, rotation and shift schedules. On[employer] is a hospital-wide system for scheduling doctors and paging doctors on call. EasyPlot is for scientific plotting and data analysis.  [email or web address]  and use [place]'s universal password to access. If you do not have the password, please contact the hospital operator.  3. Locate the [facility] provider you are looking for under [facility] and page to a number that you can be directly reached. 4. If you still have difficulty reaching the provider, please page the [facility] (Director on Call) for the Hospitalists listed on amion for assistance.  08/31/2020, 2:58 AM

## 2020-08-31 NOTE — ED Notes (Signed)
Pt responsive to voice. Denies any pain with this RN. Noted respiratory rate to go down to 9cpm, encouraged pt to continue taking deep breaths.

## 2020-08-31 NOTE — Progress Notes (Signed)
Patient is off narcan drip and asking for food.  Will put in transfer order out of ICU. [facility] to take over 12/15  Chilton Greathouse MD [facility] 08/31/2020, 4:01 PM

## 2020-08-31 NOTE — Progress Notes (Signed)
eLink Physician-Brief Progress Note Patient Name: Justin Middleton DOB: 11-30-1965 MRN: [id number]   Date of Service  08/31/2020  HPI/Events of Note  I received a consult request via secure chest for this patient from [facility] Dr. Lyda Perone, patient is a narcotic overdose with brief respiratory arrest followed by cardiac arrest. He is on a Narcan gtt.  eICU Interventions  PCCM bedside notified to consult on the patient.        Justin Middleton 08/31/2020, 4:18 AM

## 2020-08-31 NOTE — ED Notes (Signed)
Pt took out his nasal trumpet.

## 2020-09-01 ENCOUNTER — Inpatient Hospital Stay (HOSPITAL_COMMUNITY): Payer: Self-pay

## 2020-09-01 ENCOUNTER — Other Ambulatory Visit (HOSPITAL_COMMUNITY): Payer: Medicaid Other

## 2020-09-01 DIAGNOSIS — T40604D Poisoning by unspecified narcotics, undetermined, subsequent encounter: Secondary | ICD-10-CM

## 2020-09-01 DIAGNOSIS — I361 Nonrheumatic tricuspid (valve) insufficiency: Secondary | ICD-10-CM

## 2020-09-01 DIAGNOSIS — I469 Cardiac arrest, cause unspecified: Secondary | ICD-10-CM

## 2020-09-01 DIAGNOSIS — G9340 Encephalopathy, unspecified: Secondary | ICD-10-CM

## 2020-09-01 LAB — ECHOCARDIOGRAM COMPLETE
Area-P 1/2: 2.39 cm2
Height: 67 in
S' Lateral: 3.2 cm
Weight: 2716.07 oz

## 2020-09-01 NOTE — Progress Notes (Signed)
  Echocardiogram 2D Echocardiogram has been performed.  Delcie Roch 09/01/2020, 11:01 AM

## 2020-09-01 NOTE — Discharge Summary (Signed)
Justin Middleton EQA:[id number] DOB: Aug 28, 1966 DOA: 08/30/2020  PCP: Patient, No Pcp Per  Admit date: 08/30/2020 Discharge date: 09/01/2020  Admitted From: Home Disposition: Home  Recommendations for Outpatient Follow-up:  1. Follow up with PCP in 1 week 2. Please obtain BMP/CBC in one week 3.      Discharge Condition:Stable CODE STATUS: Full Diet recommendation: Heart Healthy  Brief/Interim Summary: 54 yo male found unresponsive on floor in apartment building.  Had CPR by [employer], but stopped when EMS arrived since he had a pulse and had brief improvement in mental status after receiving narcan.  UDS positive for cocaine, amphetamines.  Also had elevated blood alcohol level.  4mg  narcan total given in field. In ED, pt put on narcan gtt.  Has had improvement in mental status. Patient was taken off narcan drip and transferred to hospitalist service.  This a.m. he is doing well.  Echocardiogram revealed normal ejection fraction.  Full results below.  Patient was extensively counseled on not drinking and doing cocaine and amphetamines and risk and complications discussed with him.  He is stable for discharge.  Discharge Diagnoses:  Principal Problem:   Overdose of opiate or related narcotic [facility]) Active Problems:   Acute encephalopathy    Discharge Instructions  Discharge Instructions    Call MD for:  temperature >100.4   Complete by: As directed    Diet - low sodium heart healthy   Complete by: As directed    Increase activity slowly   Complete by: As directed      Allergies as of 09/01/2020   No Known Allergies     Medication List    STOP taking these medications   cyclobenzaprine 5 MG tablet Commonly known as: FLEXERIL   loratadine 10 MG tablet Commonly known as: CLARITIN   naproxen 500 MG tablet Commonly known as: NAPROSYN   ondansetron 8 MG disintegrating tablet Commonly known as: ZOFRAN-ODT   pantoprazole 20 MG tablet Commonly known as: PROTONIX    promethazine-dextromethorphan 6.25-15 MG/5ML syrup Commonly known as: PROMETHAZINE-DM   tadalafil 5 MG tablet Commonly known as: CIALIS       No Known Allergies  Consultations:  PCCM   Procedures/Studies: DG Chest Port 1 View  Result Date: 08/30/2020 CLINICAL DATA:  Drug overdose EXAM: PORTABLE CHEST 1 VIEW COMPARISON:  Radiograph 01/24/2020 FINDINGS: Low lung volumes with some vascular crowding and atelectatic opacities. More patchy opacity in the retrocardiac space and medial right lung base could reflect further atelectasis or sequela of aspiration in the setting of altered mental status. No pneumothorax. No effusion. Cardiomediastinal contours are unremarkable. No acute osseous or soft tissue abnormality. Telemetry leads overlie the chest. IMPRESSION: Low lung volumes with some vascular crowding and atelectatic opacities. More patchy opacity in the retrocardiac space and medial right lung base could reflect further atelectasis or sequela of aspiration in the setting of altered mental status/overdose. Electronically Signed   By: 01/26/2020 M.D.   On: 08/30/2020 23:49   ECHOCARDIOGRAM COMPLETE  Result Date: 09/01/2020    ECHOCARDIOGRAM REPORT   Patient Name:   Justin Middleton Date of Exam: 09/01/2020 Medical Rec #:  09/03/2020    Height:       67.0 in Accession #:    [id number]   Weight:       169.8 lb Date of Birth:  1965/12/01     BSA:          1.886 m Patient Age:    54 years     BP:  136/76 mmHg Patient Gender: M            HR:           72 bpm. Exam Location:  Inpatient Procedure: 2D Echo Indications:    cardiac arrest  History:        Patient has no prior history of Echocardiogram examinations.  Sonographer:    Delcie RochLauren Pennington Referring Phys: [id number] PRAVEEN MANNAM IMPRESSIONS  1. Left ventricular ejection fraction, by estimation, is 55 to 60%. The left ventricle has normal function. The left ventricle has no regional wall motion abnormalities. There is mild concentric left  ventricular hypertrophy. Left ventricular diastolic parameters are consistent with Grade I diastolic dysfunction (impaired relaxation).  2. Right ventricular systolic function is normal. The right ventricular size is normal. There is normal pulmonary artery systolic pressure.  3. The mitral valve is normal in structure. Mild mitral valve regurgitation. No evidence of mitral stenosis.  4. The aortic valve is normal in structure. Aortic valve regurgitation is not visualized. No aortic stenosis is present.  5. The inferior vena cava is normal in size with greater than 50% respiratory variability, suggesting right atrial pressure of 3 mmHg. FINDINGS  Left Ventricle: Left ventricular ejection fraction, by estimation, is 55 to 60%. The left ventricle has normal function. The left ventricle has no regional wall motion abnormalities. The left ventricular internal cavity size was normal in size. There is  mild concentric left ventricular hypertrophy. Left ventricular diastolic parameters are consistent with Grade I diastolic dysfunction (impaired relaxation). Normal left ventricular filling pressure. Right Ventricle: The right ventricular size is normal. No increase in right ventricular wall thickness. Right ventricular systolic function is normal. There is normal pulmonary artery systolic pressure. The tricuspid regurgitant velocity is 2.00 m/s, and  with an assumed right atrial pressure of 3 mmHg, the estimated right ventricular systolic pressure is 19.0 mmHg. Left Atrium: Left atrial size was normal in size. Right Atrium: Right atrial size was normal in size. Pericardium: There is no evidence of pericardial effusion. Mitral Valve: The mitral valve is normal in structure. Mild mitral valve regurgitation. No evidence of mitral valve stenosis. Tricuspid Valve: The tricuspid valve is normal in structure. Tricuspid valve regurgitation is mild . No evidence of tricuspid stenosis. Aortic Valve: The aortic valve is normal in  structure. Aortic valve regurgitation is not visualized. No aortic stenosis is present. Pulmonic Valve: The pulmonic valve was normal in structure. Pulmonic valve regurgitation is mild. No evidence of pulmonic stenosis. Aorta: The aortic root is normal in size and structure. Venous: The inferior vena cava is normal in size with greater than 50% respiratory variability, suggesting right atrial pressure of 3 mmHg. IAS/Shunts: No atrial level shunt detected by color flow Doppler.  LEFT VENTRICLE PLAX 2D LVIDd:         4.70 cm Diastology LVIDs:         3.20 cm LV e' medial:    12.30 cm/s LV PW:         1.10 cm LV E/e' medial:  8.0 LV IVS:        1.00 cm LV e' lateral:   14.50 cm/s                        LV E/e' lateral: 6.8  RIGHT VENTRICLE             IVC RV S prime:     15.20 cm/s  IVC diam: 1.20 cm TAPSE (M-mode): 2.6  cm LEFT ATRIUM             Index       RIGHT ATRIUM           Index LA diam:        3.80 cm 2.01 cm/m  RA Area:     14.20 cm LA Vol (A2C):   43.3 ml 22.96 ml/m RA Volume:   33.00 ml  17.50 ml/m LA Vol (A4C):   59.4 ml 31.49 ml/m LA Biplane Vol: 53.1 ml 28.15 ml/m  AORTIC VALVE LVOT Vmax:   120.00 cm/s LVOT Vmean:  68.200 cm/s LVOT VTI:    0.232 m  AORTA Ao Asc diam: 3.00 cm MITRAL VALVE               TRICUSPID VALVE MV Area (PHT): 2.39 cm    TR Peak grad:   16.0 mmHg MV Decel Time: 317 msec    TR Vmax:        200.00 cm/s MV E velocity: 98.80 cm/s MV A velocity: 83.90 cm/s  SHUNTS MV E/A ratio:  1.18        Systemic VTI: 0.23 m Tobias Alexander MD Electronically signed by Tobias Alexander MD Signature Date/Time: 09/01/2020/1:09:59 PM    Final        Subjective: No complaints this AM.  Denies chest pain or shortness of breath  Discharge Exam: Vitals:   09/01/20 0552 09/01/20 1208  BP: 136/76 (!) 141/73  Pulse: 77 74  Resp: 14 18  Temp: 98.7 F (37.1 C) 98.7 F (37.1 C)  SpO2: 94% 97%   Vitals:   08/31/20 1825 08/31/20 2218 09/01/20 0552 09/01/20 1208  BP: 131/78 (!) 121/51  136/76 (!) 141/73  Pulse: 76 64 77 74  Resp: Temp: 98 F (36.7 C) 98.6 F (37 C) 98.7 F (37.1 C) 98.7 F (37.1 C)  TempSrc: Oral Oral Oral Oral  SpO2: 97% 95% 94% 97%  Weight:  77 kg    Height:        General: Pt is alert, awake, not in acute distress Cardiovascular: RRR, S1/S2 +, no rubs, no gallops Respiratory: CTA bilaterally, no wheezing, no rhonchi Abdominal: Soft, NT, ND, bowel sounds + Extremities: no edema, no cyanosis    The results of significant diagnostics from this hospitalization (including imaging, microbiology, ancillary and laboratory) are listed below for reference.     Microbiology: Recent Results (from the past 240 hour(s))  Resp Panel by RT-PCR (Flu A&B, Covid) Nasopharyngeal Swab     Status: None   Collection Time: 08/30/20 11:33 PM   Specimen: Nasopharyngeal Swab; Nasopharyngeal(NP) swabs in vial transport medium  Result Value Ref Range Status   SARS Coronavirus 2 by RT PCR NEGATIVE NEGATIVE Final    Comment: (NOTE) SARS-CoV-2 target nucleic acids are NOT DETECTED.  The SARS-CoV-2 RNA is generally detectable in upper respiratory specimens during the acute phase of infection. The lowest concentration of SARS-CoV-2 viral copies this assay can detect is 138 copies/mL. A negative result does not preclude SARS-Cov-2 infection and should not be used as the sole basis for treatment or other patient management decisions. A negative result may occur with  improper specimen collection/handling, submission of specimen other than nasopharyngeal swab, presence of viral mutation(s) within the areas targeted by this assay, and inadequate number of viral copies(<138 copies/mL). A negative result must be combined with clinical observations, patient history, and epidemiological information. The expected result is Negative.  Fact Sheet for Patients:  BloggerCourse.com  Fact Sheet for Healthcare Providers:   SeriousBroker.it  This test is no t yet approved or cleared by the [place] FDA and  has been authorized for detection and/or diagnosis of SARS-CoV-2 by FDA under an Emergency Use Authorization (EUA). This EUA will remain  in effect (meaning this test can be used) for the duration of the COVID-19 declaration under Section 564(b)(1) of the Act, 21 U.S.C.section 360bbb-3(b)(1), unless the authorization is terminated  or revoked sooner.       Influenza A by PCR NEGATIVE NEGATIVE Final   Influenza B by PCR NEGATIVE NEGATIVE Final    Comment: (NOTE) The Xpert Xpress SARS-CoV-2/FLU/RSV plus assay is intended as an aid in the diagnosis of influenza from Nasopharyngeal swab specimens and should not be used as a sole basis for treatment. Nasal washings and aspirates are unacceptable for Xpert Xpress SARS-CoV-2/FLU/RSV testing.  Fact Sheet for Patients: BloggerCourse.com  Fact Sheet for Healthcare Providers: SeriousBroker.it  This test is not yet approved or cleared by the [place] FDA and has been authorized for detection and/or diagnosis of SARS-CoV-2 by FDA under an Emergency Use Authorization (EUA). This EUA will remain in effect (meaning this test can be used) for the duration of the COVID-19 declaration under Section 564(b)(1) of the Act, 21 U.S.C. section 360bbb-3(b)(1), unless the authorization is terminated or revoked.  Performed at [facility], [address] [place], [place] [address]   MRSA PCR Screening     Status: None   Collection Time: 08/31/20 10:48 AM   Specimen: Nasal Mucosa; Nasopharyngeal  Result Value Ref Range Status   MRSA by PCR NEGATIVE NEGATIVE Final    Comment:        The GeneXpert MRSA Assay (FDA approved for NASAL specimens only), is one component of a comprehensive MRSA colonization surveillance program. It is not intended to diagnose MRSA infection nor  to guide or monitor treatment for MRSA infections. Performed at [facility], [address] [place], [place] [address]      Labs: BNP (last 3 results) No results for input(s): BNP in the last 8760 hours. Basic Metabolic Panel: Recent Labs  Lab 08/30/20 2325 08/31/20 0411 08/31/20 0540 08/31/20 0548  NA 136  --  134* 134*  K 2.8*  --  4.2 4.3  CL 99  --   --  101  CO2 19*  --   --  22  GLUCOSE 240*  --   --  115*  BUN 12  --   --  10  CREATININE 1.42*  --   --  0.96  CALCIUM 8.6*  --   --  9.2  MG  --  1.6*  --   --    Liver Function Tests: Recent Labs  Lab 08/30/20 2325  AST 81*  ALT 92*  ALKPHOS 41  BILITOT 1.0  PROT 7.2  ALBUMIN 3.9   No results for input(s): LIPASE, AMYLASE in the last 168 hours. No results for input(s): AMMONIA in the last 168 hours. CBC: Recent Labs  Lab 08/29/20 1614 08/30/20 2325 08/31/20 0411 08/31/20 0540  WBC 4.8 5.7 10.3  --   NEUTROABS  --  3.2  --   --   HGB 14.5 13.6 13.3 13.6  HCT 44.9 43.9 42.5 40.0  MCV 86.0 90.0 88.9  --   PLT 261 270 218  --    Cardiac Enzymes: No results for input(s): CKTOTAL, CKMB, CKMBINDEX, TROPONINI in the last 168 hours. BNP:  Invalid input(s): POCBNP CBG: Recent Labs  Lab 08/30/20 2349 08/31/20 0444 08/31/20 0803 08/31/20 1134 08/31/20 1527  GLUCAP 233* 107* 110* 104* 91   D-Dimer No results for input(s): DDIMER in the last 72 hours. Hgb A1c Recent Labs    08/31/20 0411  HGBA1C 5.0   Lipid Profile No results for input(s): CHOL, HDL, LDLCALC, TRIG, CHOLHDL, LDLDIRECT in the last 72 hours. Thyroid function studies No results for input(s): TSH, T4TOTAL, T3FREE, THYROIDAB in the last 72 hours.  Invalid input(s): FREET3 Anemia work up No results for input(s): VITAMINB12, FOLATE, FERRITIN, TIBC, IRON, RETICCTPCT in the last 72 hours. Urinalysis    Component Value Date/Time   COLORURINE STRAW (A) 05/22/2017 1430   APPEARANCEUR CLEAR 05/22/2017 1430   LABSPEC 1.004 (L)  05/22/2017 1430   PHURINE 6.0 05/22/2017 1430   GLUCOSEU NEGATIVE 05/22/2017 1430   HGBUR NEGATIVE 05/22/2017 1430   BILIRUBINUR NEGATIVE 05/22/2017 1430   KETONESUR NEGATIVE 05/22/2017 1430   PROTEINUR NEGATIVE 05/22/2017 1430   NITRITE NEGATIVE 05/22/2017 1430   LEUKOCYTESUR NEGATIVE 05/22/2017 1430   Sepsis Labs Invalid input(s): PROCALCITONIN,  WBC,  LACTICIDVEN Microbiology Recent Results (from the past 240 hour(s))  Resp Panel by RT-PCR (Flu A&B, Covid) Nasopharyngeal Swab     Status: None   Collection Time: 08/30/20 11:33 PM   Specimen: Nasopharyngeal Swab; Nasopharyngeal(NP) swabs in vial transport medium  Result Value Ref Range Status   SARS Coronavirus 2 by RT PCR NEGATIVE NEGATIVE Final    Comment: (NOTE) SARS-CoV-2 target nucleic acids are NOT DETECTED.  The SARS-CoV-2 RNA is generally detectable in upper respiratory specimens during the acute phase of infection. The lowest concentration of SARS-CoV-2 viral copies this assay can detect is 138 copies/mL. A negative result does not preclude SARS-Cov-2 infection and should not be used as the sole basis for treatment or other patient management decisions. A negative result may occur with  improper specimen collection/handling, submission of specimen other than nasopharyngeal swab, presence of viral mutation(s) within the areas targeted by this assay, and inadequate number of viral copies(<138 copies/mL). A negative result must be combined with clinical observations, patient history, and epidemiological information. The expected result is Negative.  Fact Sheet for Patients:  BloggerCourse.com  Fact Sheet for Healthcare Providers:  SeriousBroker.it  This test is no t yet approved or cleared by the [place] FDA and  has been authorized for detection and/or diagnosis of SARS-CoV-2 by FDA under an Emergency Use Authorization (EUA). This EUA will remain  in effect  (meaning this test can be used) for the duration of the COVID-19 declaration under Section 564(b)(1) of the Act, 21 U.S.C.section 360bbb-3(b)(1), unless the authorization is terminated  or revoked sooner.       Influenza A by PCR NEGATIVE NEGATIVE Final   Influenza B by PCR NEGATIVE NEGATIVE Final    Comment: (NOTE) The Xpert Xpress SARS-CoV-2/FLU/RSV plus assay is intended as an aid in the diagnosis of influenza from Nasopharyngeal swab specimens and should not be used as a sole basis for treatment. Nasal washings and aspirates are unacceptable for Xpert Xpress SARS-CoV-2/FLU/RSV testing.  Fact Sheet for Patients: BloggerCourse.com  Fact Sheet for Healthcare Providers: SeriousBroker.it  This test is not yet approved or cleared by the [place] FDA and has been authorized for detection and/or diagnosis of SARS-CoV-2 by FDA under an Emergency Use Authorization (EUA). This EUA will remain in effect (meaning this test can be used) for the duration of the COVID-19 declaration under Section 564(b)(1) of the  Act, 21 U.S.C. section 360bbb-3(b)(1), unless the authorization is terminated or revoked.  Performed at [facility], [address], [place], [place] [address]   MRSA PCR Screening     Status: None   Collection Time: 08/31/20 10:48 AM   Specimen: Nasal Mucosa; Nasopharyngeal  Result Value Ref Range Status   MRSA by PCR NEGATIVE NEGATIVE Final    Comment:        The GeneXpert MRSA Assay (FDA approved for NASAL specimens only), is one component of a comprehensive MRSA colonization surveillance program. It is not intended to diagnose MRSA infection nor to guide or monitor treatment for MRSA infections. Performed at [facility], [address] [place], [place] [address]      Time coordinating discharge: Over 30 minutes  SIGNED:   Lynn Ito, MD  [facility] 09/01/2020, 1:11 PM Pager    If 7PM-7AM, please contact night-coverage [email or web address] Password [email or web address]

## 2020-09-01 NOTE — Progress Notes (Signed)
DISCHARGE NOTE HOME Justin Middleton to be discharged Home per MD order. Discussed prescriptions and follow up appointments with the patient. Prescriptions given to patient; medication list explained in detail. Patient verbalized understanding.  Skin clean, dry and intact without evidence of skin break down, no evidence of skin tears noted. IV catheter discontinued intact. Site without signs and symptoms of complications. Dressing and pressure applied. Pt denies pain at the site currently. No complaints noted.  Patient free of lines, drains, and wounds.   An After Visit Summary (AVS) was printed and given to the patient. Patient escorted via wheelchair, and discharged home via private auto.  Lorine Bears, RN

## 2020-10-20 ENCOUNTER — Encounter (HOSPITAL_COMMUNITY): Payer: Self-pay

## 2020-10-20 ENCOUNTER — Other Ambulatory Visit: Payer: Self-pay

## 2020-10-20 ENCOUNTER — Ambulatory Visit (HOSPITAL_COMMUNITY)
Admission: EM | Admit: 2020-10-20 | Discharge: 2020-10-20 | Disposition: A | Discharge status: Home / Self Care | Payer: Medicaid Other | Attending: Emergency Medicine | Admitting: Emergency Medicine

## 2020-10-20 DIAGNOSIS — K120 Recurrent oral aphthae: Secondary | ICD-10-CM | POA: Insufficient documentation

## 2020-10-20 DIAGNOSIS — Z113 Encounter for screening for infections with a predominantly sexual mode of transmission: Secondary | ICD-10-CM

## 2020-10-20 NOTE — ED Triage Notes (Signed)
Pt reports he had an STD check in December 2021. He states he was seen before for red and purple bumps on the tip of his tongue. Pt states the provider he was before told him if it got worse to return for him to be evaluated again. He states the bumps have not gone away.

## 2020-10-20 NOTE — Discharge Instructions (Signed)
Your test results should be back in 3 to 5 days. Do not engage in sexual activity until the test results are back. We will call you if your test results are positive.

## 2020-10-20 NOTE — ED Provider Notes (Signed)
[facility]    CSN: [id number] Arrival date & time: 10/20/20  1013      History   Chief Complaint Chief Complaint  Patient presents with  . SEXUALLY TRANSMITTED DISEASE    HPI Justin Middleton is a 55 y.o. male.   Patient presents with sores on his tongue intermittently for the last few months.  He would like these checked for STDs.  He denies fever, chills, penile lesions, penile discharge, testicular pain, abdominal pain, back pain, dysuria, or other symptoms.  No treatments attempted at home.  His medical history includes acute encephalopathy, hypokalemia, metabolic acidosis, acute kidney injury, narcotic overdose on 08/30/2020, polysubstance abuse.  The history is provided by the patient and medical records.    Past Medical History:  Diagnosis Date  . No pertinent past medical history   . Polysubstance abuse [facility])     Patient Active Problem List   Diagnosis Date Noted  . Overdose of opiate or related narcotic (HCC) 08/31/2020  . Acute encephalopathy 08/31/2020  . Accidental drug overdose   . Hypokalemia   . Increased anion gap metabolic acidosis   . AKI (acute kidney injury) (HCC)     History reviewed. No pertinent surgical history.     Home Medications    Prior to Admission medications   Not on File    Family History Family History  Problem Relation Age of Onset  . Diabetes Mother   . Healthy Father     Social History Social History   Tobacco Use  . Smoking status: Never Smoker  . Smokeless tobacco: Never Used  Vaping Use  . Vaping Use: Never used  Substance Use Topics  . Alcohol use: Yes    Comment: weekends  . Drug use: Yes    Types: Amphetamines, Cocaine, Fentanyl     Allergies   Patient has no known allergies.   Review of Systems Review of Systems  Constitutional: Negative for chills and fever.  HENT: Negative for ear pain and sore throat.   Eyes: Negative for pain and visual disturbance.  Respiratory: Negative for cough  and shortness of breath.   Cardiovascular: Negative for chest pain and palpitations.  Gastrointestinal: Negative for abdominal pain and vomiting.  Genitourinary: Negative for dysuria, hematuria, penile discharge and testicular pain.  Musculoskeletal: Negative for arthralgias and back pain.  Skin: Positive for rash. Negative for color change.  Neurological: Negative for seizures and syncope.  All other systems reviewed and are negative.    Physical Exam Triage Vital Signs ED Triage Vitals  Enc Vitals Group     BP      Pulse      Resp      Temp      Temp src      SpO2      Weight      Height      Head Circumference      Peak Flow      Pain Score      Pain Loc      Pain Edu?      Excl. in GC?    No data found.  Updated Vital Signs BP (!) 143/87 (BP Location: Right Arm)   Pulse 69   Temp 98.8 F (37.1 C) (Oral)   Resp 17   SpO2 98%   Visual Acuity Right Eye Distance:   Left Eye Distance:   Bilateral Distance:    Right Eye Near:   Left Eye Near:    Bilateral Near:  Physical Exam Vitals and nursing note reviewed.  Constitutional:      General: He is not in acute distress.    Appearance: He is well-developed and well-nourished. He is not ill-appearing.  HENT:     Head: Normocephalic and atraumatic.     Mouth/Throat:     Mouth: Mucous membranes are moist.     Tongue: Lesions present.      Comments: Approximately 2 mm tender red papule. Eyes:     Conjunctiva/sclera: Conjunctivae normal.  Cardiovascular:     Rate and Rhythm: Normal rate and regular rhythm.     Heart sounds: Normal heart sounds.  Pulmonary:     Effort: Pulmonary effort is normal. No respiratory distress.     Breath sounds: Normal breath sounds.  Abdominal:     Palpations: Abdomen is soft.     Tenderness: There is no abdominal tenderness.  Musculoskeletal:        General: No edema.     Cervical back: Neck supple.  Skin:    General: Skin is warm and dry.  Neurological:     General:  No focal deficit present.     Mental Status: He is alert and oriented to person, place, and time.  Psychiatric:        Mood and Affect: Mood and affect and mood normal.        Behavior: Behavior normal.      UC Treatments / Results  Labs (all labs ordered are listed, but only abnormal results are displayed) Labs Reviewed  HSV CULTURE AND TYPING  CYTOLOGY, (ORAL, ANAL, URETHRAL) ANCILLARY ONLY    EKG   Radiology No results found.  Procedures Procedures (including critical care time)  Medications Ordered in UC Medications - No data to display  Initial Impression / Assessment and Plan / UC Course  I have reviewed the triage vital signs and the nursing notes.  Pertinent labs & imaging results that were available during my care of the patient were reviewed by me and considered in my medical decision making (see chart for details).   Aphthous ulcer of the mouth, screening for STDs.  Oral swab obtained for STD screening.  HSV swab obtained from tongue lesion.  Instructed patient to abstain from sexual activity until the test results are back.  Discussed that we will call him if his test results are positive.  He agrees to plan of care.   Final Clinical Impressions(s) / UC Diagnoses   Final diagnoses:  Aphthous ulcer of mouth  Screen for STD (sexually transmitted disease)     Discharge Instructions     Your test results should be back in 3 to 5 days. Do not engage in sexual activity until the test results are back. We will call you if your test results are positive.        ED Prescriptions    None     PDMP not reviewed this encounter.   Mickie Bail, NP 10/20/20 [phone]

## 2020-10-22 ENCOUNTER — Telehealth (HOSPITAL_COMMUNITY): Payer: Self-pay | Admitting: Emergency Medicine

## 2020-10-22 LAB — CYTOLOGY, (ORAL, ANAL, URETHRAL) ANCILLARY ONLY
Chlamydia: NEGATIVE
Comment: NEGATIVE
Comment: NEGATIVE
Comment: NORMAL
Neisseria Gonorrhea: NEGATIVE
Trichomonas: POSITIVE — AB

## 2020-10-22 MED ORDER — METRONIDAZOLE 500 MG PO TABS: 500.0000 mg | ORAL_TABLET | Freq: Two times a day (BID) | ORAL | 0 refills | Status: DC

## 2020-10-23 LAB — HSV CULTURE AND TYPING

## 2021-01-02 IMAGING — CR DG CHEST 2V
2 series · 2 of 2 positions shown · non-contrast
Comparison: None.

CLINICAL DATA: Chest pain

EXAM:
CHEST - 2 VIEW

[chest lat]
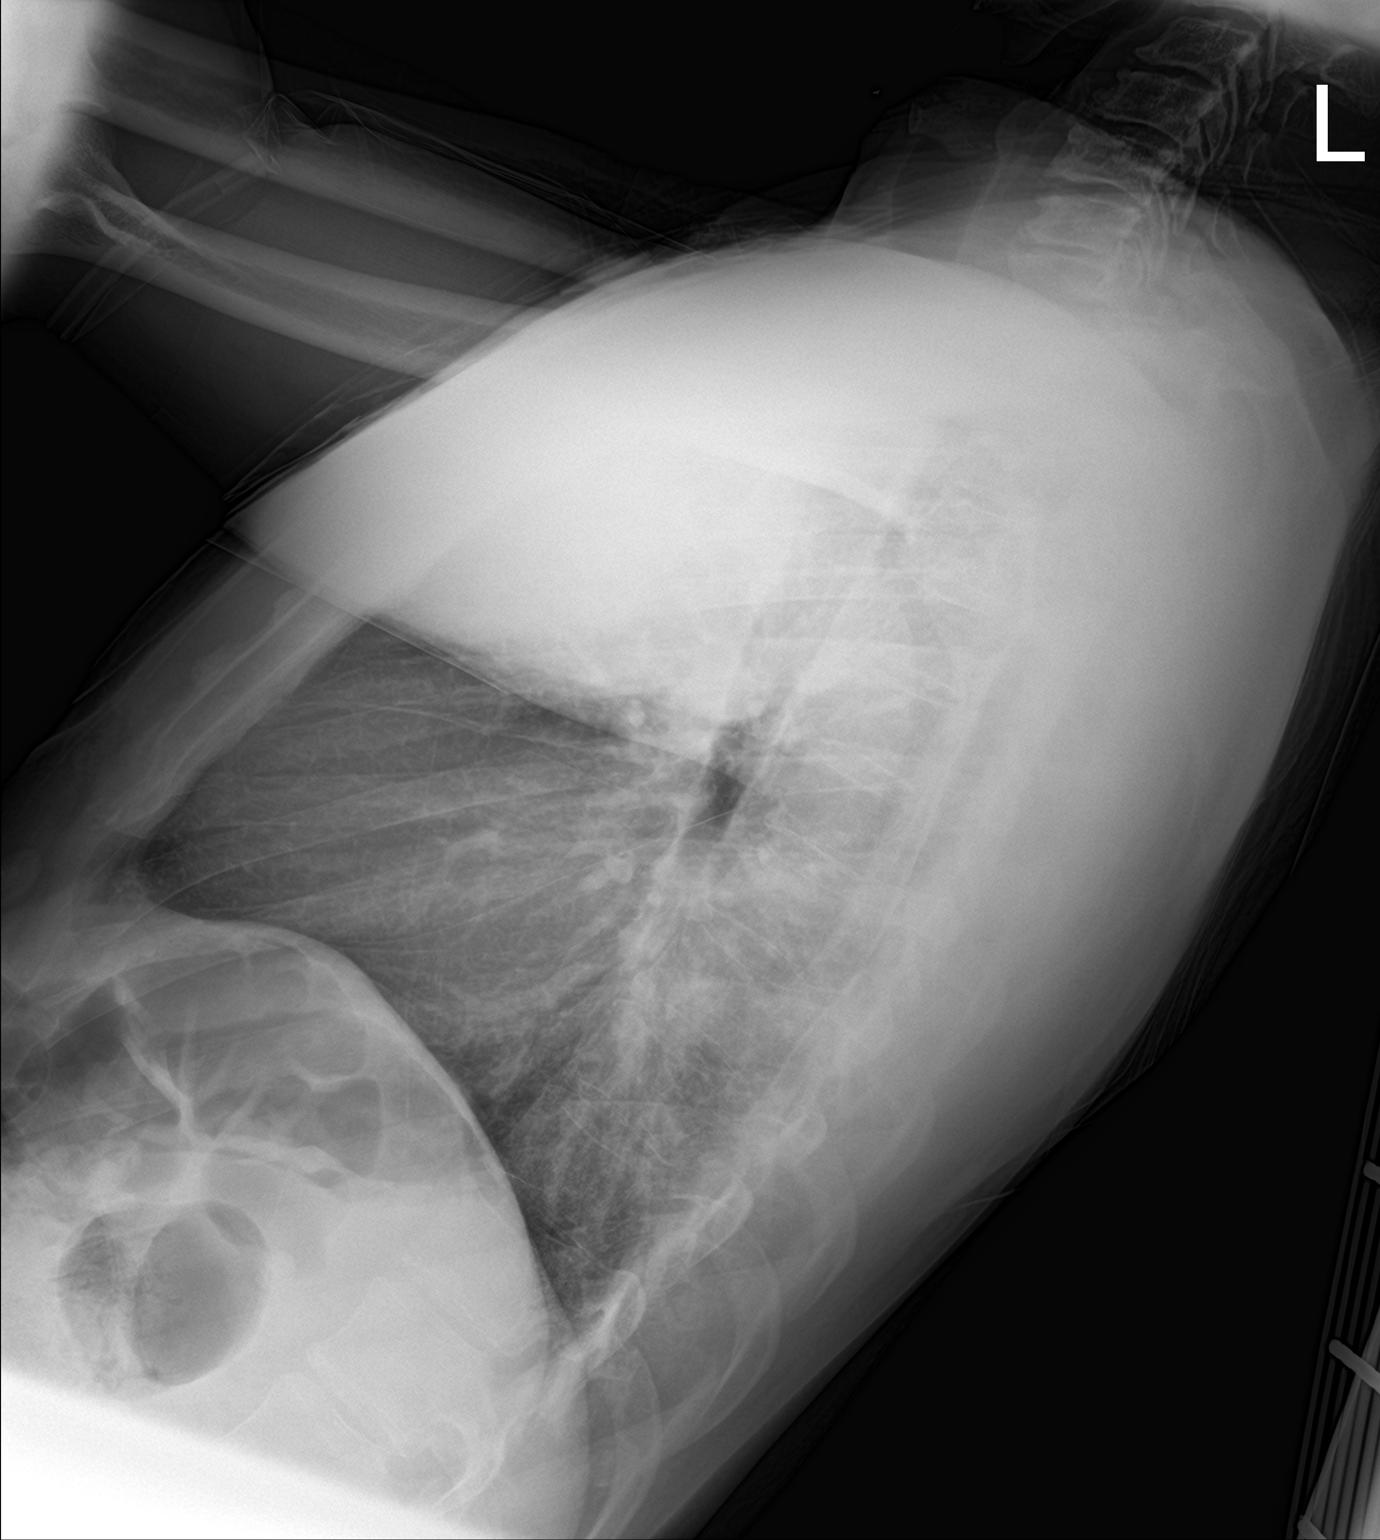

[chest ap]
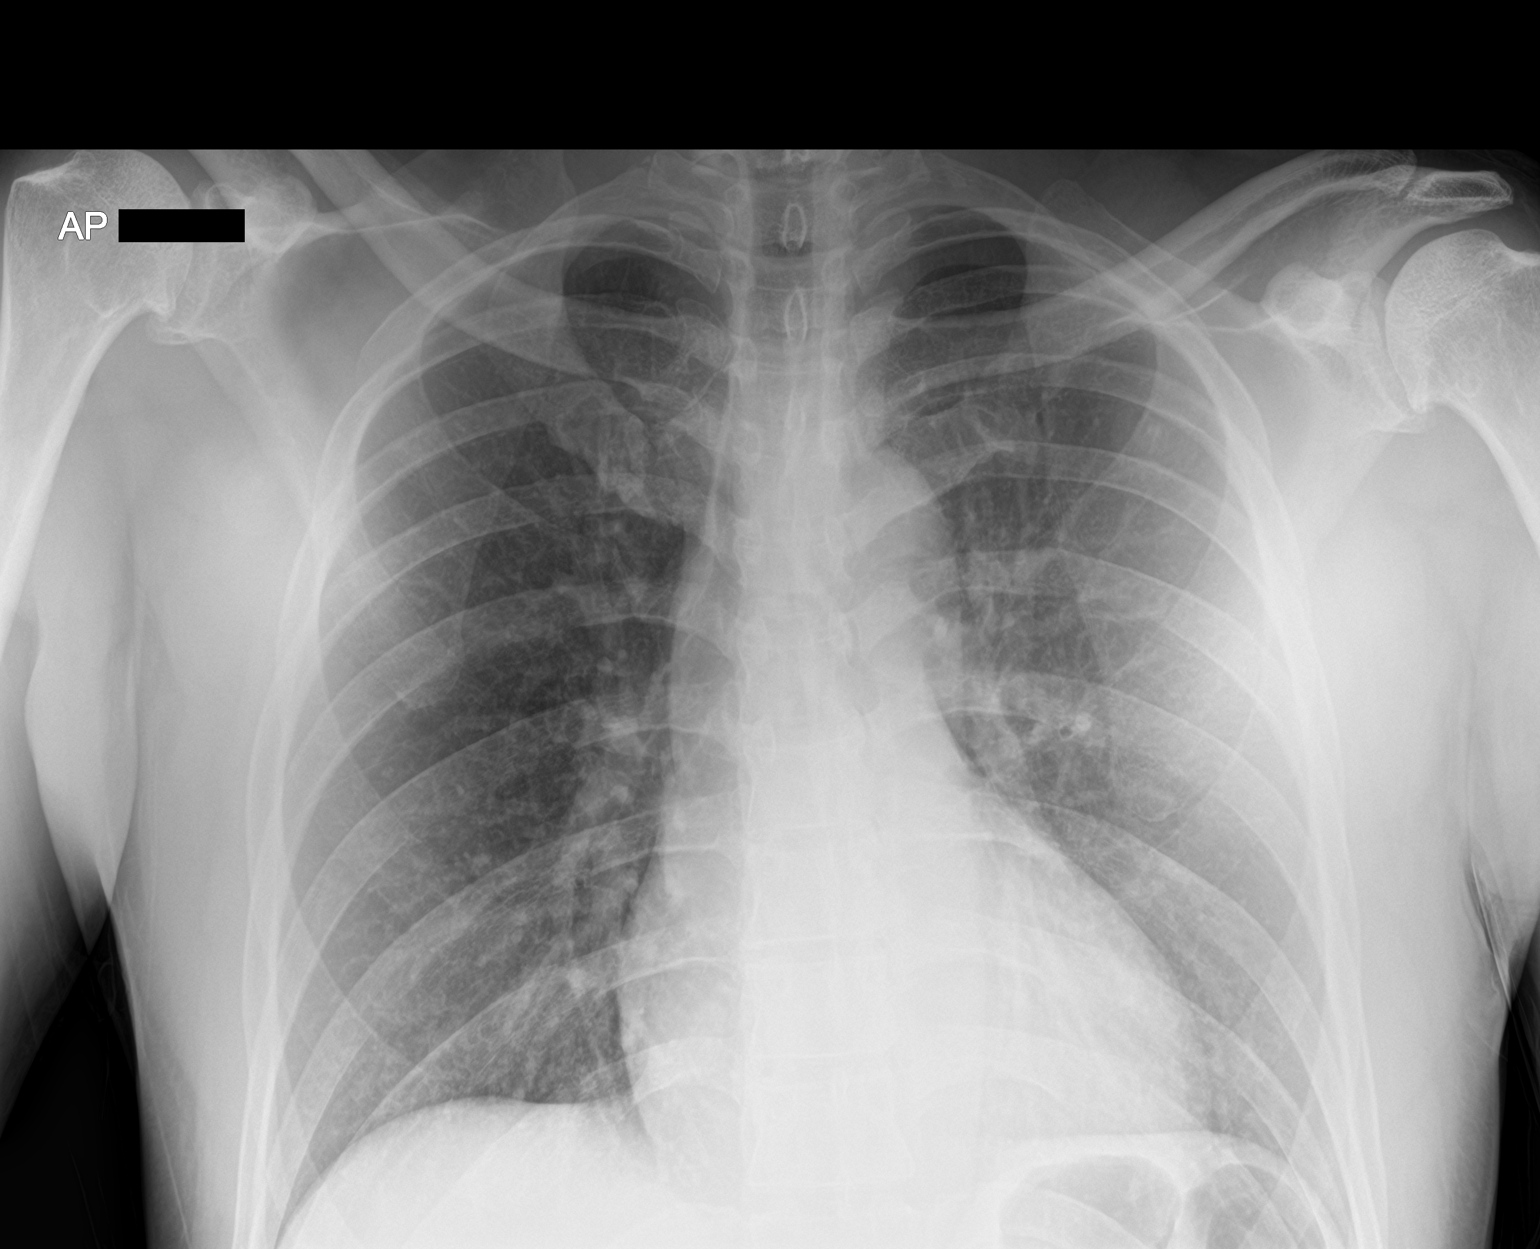

[2 of 2 positions shown; findings below may reference images not displayed]

FINDINGS: Lungs are clear. Heart size and pulmonary vascularity are normal. No
adenopathy. No pneumothorax. No bone lesions.
IMPRESSION: Lungs clear.  Cardiac silhouette within normal limits.

## 2021-08-09 IMAGING — DX DG CHEST 1V PORT
1 series · 1 of 1 positions shown · non-contrast
Comparison: Radiograph 01/24/2020

CLINICAL DATA: Drug overdose

EXAM:
PORTABLE CHEST 1 VIEW

[chest ap]
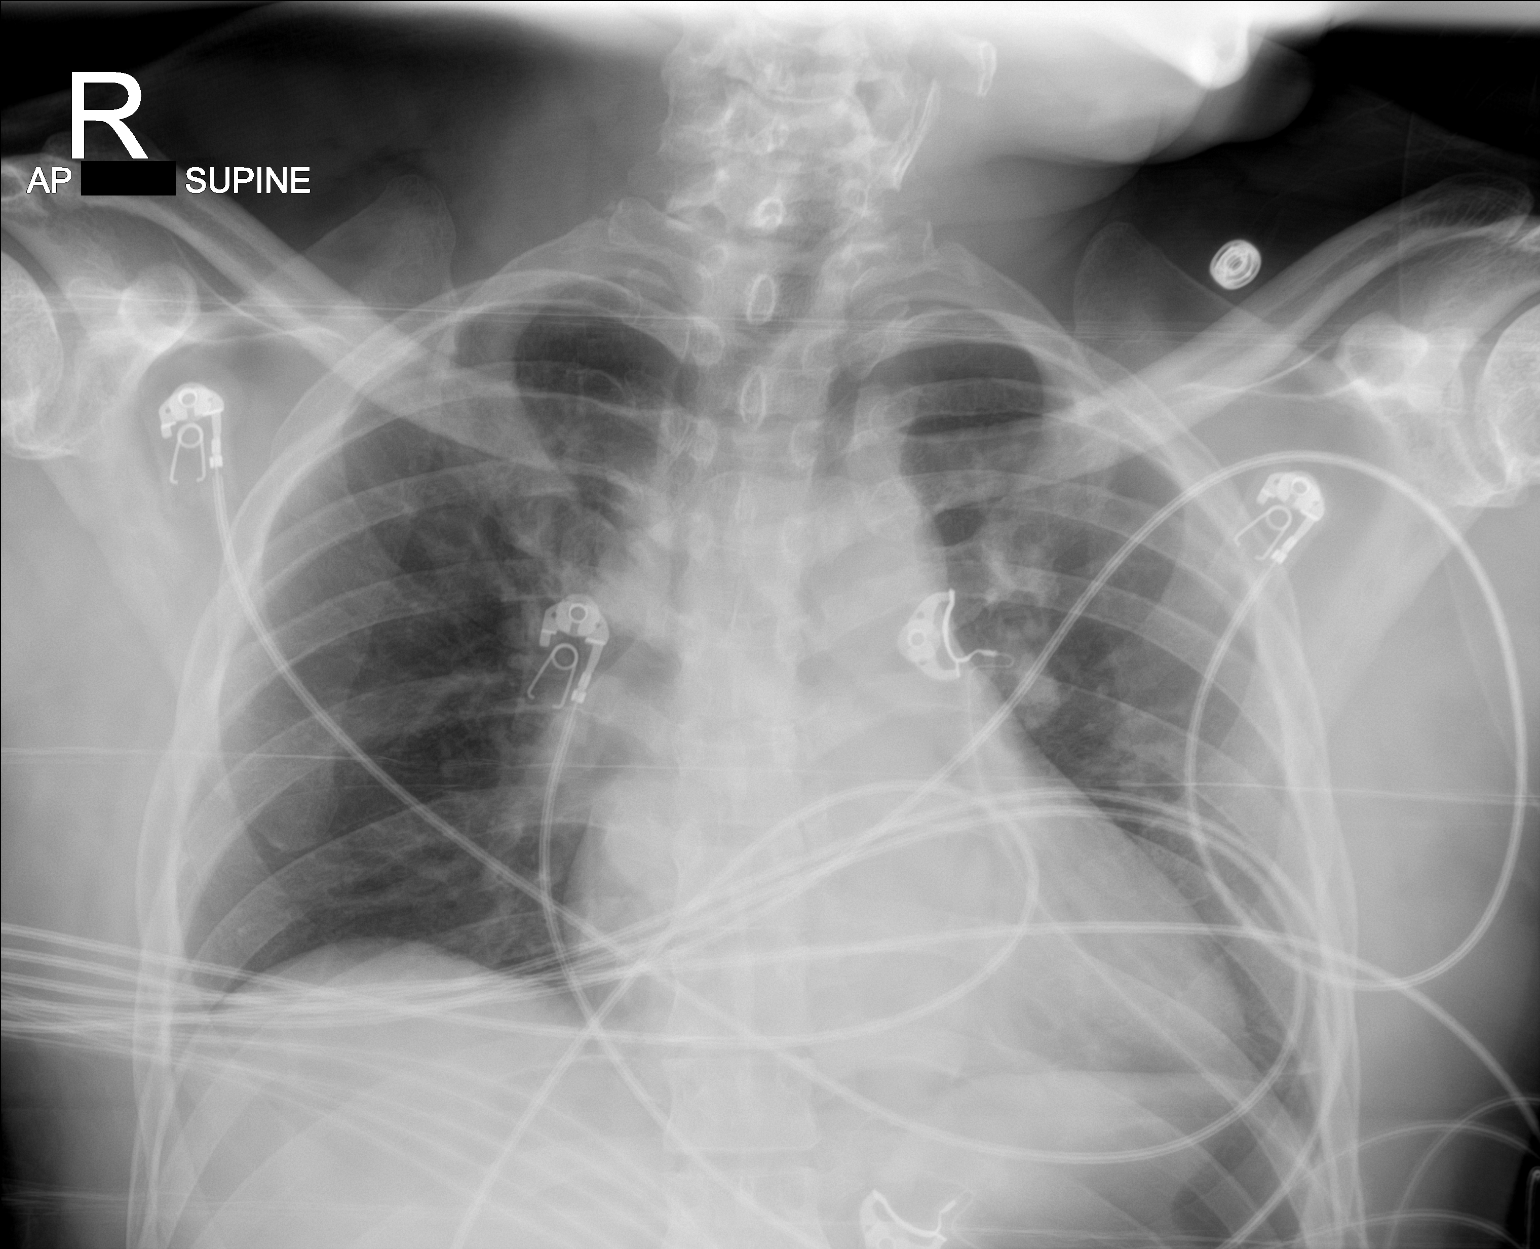

[1 of 1 positions shown; findings below may reference images not displayed]

FINDINGS: Low lung volumes with some vascular crowding and atelectatic
opacities. More patchy opacity in the retrocardiac space and medial
right lung base could reflect further atelectasis or sequela of
aspiration in the setting of altered mental status. No pneumothorax.
No effusion. Cardiomediastinal contours are unremarkable. No acute
osseous or soft tissue abnormality. Telemetry leads overlie the
chest.
IMPRESSION: Low lung volumes with some vascular crowding and atelectatic
opacities.

More patchy opacity in the retrocardiac space and medial right lung
base could reflect further atelectasis or sequela of aspiration in
the setting of altered mental status/overdose.

## 2022-01-17 ENCOUNTER — Encounter (HOSPITAL_COMMUNITY): Payer: Self-pay

## 2022-01-17 ENCOUNTER — Ambulatory Visit (HOSPITAL_COMMUNITY)
Admission: EM | Admit: 2022-01-17 | Discharge: 2022-01-17 | Disposition: A | Discharge status: Home / Self Care | Payer: Medicaid Other | Attending: Internal Medicine | Admitting: Internal Medicine

## 2022-01-17 DIAGNOSIS — M25562 Pain in left knee: Secondary | ICD-10-CM | POA: Insufficient documentation

## 2022-01-17 DIAGNOSIS — Z202 Contact with and (suspected) exposure to infections with a predominantly sexual mode of transmission: Secondary | ICD-10-CM | POA: Insufficient documentation

## 2022-01-17 MED ORDER — NAPROXEN 375 MG PO TABS: 375.0000 mg | ORAL_TABLET | Freq: Two times a day (BID) | ORAL | 0 refills | Status: AC

## 2022-01-17 NOTE — ED Triage Notes (Signed)
Pt c/o lower back and lt leg pain for the past 13 year. States pain has worsen and needs pain medication. States needs to be seen to have on his records. Denies any new injury. ? ?Pt requesting STD screening after receiving oral sex. Pt states feels different to groin area. Has had a small amount of discharge one time.  ?

## 2022-01-17 NOTE — Discharge Instructions (Addendum)
This take medications as prescribed ?Wear your knee sleeve if you are going to stand for a long time or if you sleeping ?Icing of the left knee ?Gentle stretching exercises ?Return to urgent care if symptoms worsen ?Reach out to occupational health to help with workplace accommodation. ? ?

## 2022-01-20 LAB — CYTOLOGY, (ORAL, ANAL, URETHRAL) ANCILLARY ONLY
Chlamydia: NEGATIVE
Comment: NEGATIVE
Comment: NEGATIVE
Comment: NORMAL
Neisseria Gonorrhea: NEGATIVE
Trichomonas: NEGATIVE

## 2022-01-21 NOTE — ED Provider Notes (Signed)
[facility]    CSN: [id number] Arrival date & time: 01/17/22  1910      History   Chief Complaint Chief Complaint  Patient presents with   Back Pain   Leg Pain   SEXUALLY TRANSMITTED DISEASE    HPI Justin Middleton is a 56 y.o. male comes to urgent care for STD screening.  Patient recently engaged in oral sex with a random partner. Patient has some groin irritation and some penile discharge.  Patient also complains of left lower back pain with some left leg pain.  He has had the symptoms recurrently over the past 13 years.  No falls or trauma at this time.  Pain is of moderate severity but does not particularly radiate down the left leg.  Pain is mainly in the left knee area.  Pain is throbbing, of moderate severity, aggravated by standing or movement.  No known relieving factors.  No knee swelling.  No bruising over the left knee.  The knee does not give way when he is walking.  His job apparently aggravates the knee pain and is working on Therapist, art. for chronic left knee pain.  HPI  Past Medical History:  Diagnosis Date   No pertinent past medical history    Polysubstance abuse [facility])     Patient Active Problem List   Diagnosis Date Noted   Overdose of opiate or related narcotic (HCC) 08/31/2020   Acute encephalopathy 08/31/2020   Accidental drug overdose    Hypokalemia    Increased anion gap metabolic acidosis    AKI (acute kidney injury) (HCC)     History reviewed. No pertinent surgical history.     Home Medications    Prior to Admission medications   Medication Sig Start Date End Date Taking? Authorizing Provider  naproxen (NAPROSYN) 375 MG tablet Take 1 tablet (375 mg total) by mouth 2 (two) times daily. 01/17/22  Yes Aundre Hietala, Britta Mccreedy, MD    Family History Family History  Problem Relation Age of Onset   Diabetes Mother    Healthy Father     Social History Social History   Tobacco Use   Smoking status: Never   Smokeless tobacco:  Never  Vaping Use   Vaping Use: Never used  Substance Use Topics   Alcohol use: Yes    Comment: weekends   Drug use: Yes    Types: Amphetamines, Cocaine, Fentanyl     Allergies   Patient has no known allergies.   Review of Systems Review of Systems As per HPI  Physical Exam Triage Vital Signs ED Triage Vitals [01/17/22 1943]  Enc Vitals Group     BP (!) 159/87     Pulse Rate 68     Resp 18     Temp 98.3 F (36.8 C)     Temp Source Oral     SpO2 97 %     Weight      Height      Head Circumference      Peak Flow      Pain Score 7     Pain Loc      Pain Edu?      Excl. in [place]?    No data found.  Updated Vital Signs BP (!) 159/87 (BP Location: Left Arm)   Pulse 68   Temp 98.3 F (36.8 C) (Oral)   Resp 18   SpO2 97%   Visual Acuity Right Eye Distance:   Left Eye Distance:   Bilateral  Distance:    Right Eye Near:   Left Eye Near:    Bilateral Near:     Physical Exam Vitals and nursing note reviewed.  Constitutional:      General: He is not in acute distress.    Appearance: He is not ill-appearing.  Cardiovascular:     Rate and Rhythm: Normal rate and regular rhythm.     Pulses: Normal pulses.     Heart sounds: Normal heart sounds.  Pulmonary:     Effort: Pulmonary effort is normal.     Breath sounds: Normal breath sounds.  Abdominal:     General: Bowel sounds are normal.     Palpations: Abdomen is soft.  Musculoskeletal:        General: No swelling, tenderness, deformity or signs of injury. Normal range of motion.  Skin:    General: Skin is warm and dry.  Neurological:     Mental Status: He is alert.     UC Treatments / Results  Labs (all labs ordered are listed, but only abnormal results are displayed) Labs Reviewed  CYTOLOGY, (ORAL, ANAL, URETHRAL) ANCILLARY ONLY    EKG   Radiology No results found.  Procedures Procedures (including critical care time)  Medications Ordered in UC Medications - No data to display  Initial  Impression / Assessment and Plan / UC Course  I have reviewed the triage vital signs and the nursing notes.  Pertinent labs & imaging results that were available during my care of the patient were reviewed by me and considered in my medical decision making (see chart for details).     1.  STD exposure: Cytology for GC/chlamydia/trichomonas Avoid sexual intercourse until lab results are available We will call you with recommendations of labs abnormal Safe sex practices recommended  2.  Left knee and lower back pain, chronic: Naproxen 375 mg twice daily as needed for pain Icing of the left knee Gentle stretching exercises and quadricep strengthening Patient is given referral to occupational health for further evaluation and workplace accommodation recommendations. Final Clinical Impressions(s) / UC Diagnoses   Final diagnoses:  STD exposure  Left knee pain, unspecified chronicity     Discharge Instructions      This take medications as prescribed Wear your knee sleeve if you are going to stand for a long time or if you sleeping Icing of the left knee Gentle stretching exercises Return to urgent care if symptoms worsen Reach out to occupational health to help with workplace accommodation.    ED Prescriptions     Medication Sig Dispense Auth. Provider   naproxen (NAPROSYN) 375 MG tablet Take 1 tablet (375 mg total) by mouth 2 (two) times daily. 30 tablet Akeem Heppler, Britta Mccreedy, MD      PDMP not reviewed this encounter.   Merrilee Jansky, MD 01/23/22 1242

## 2022-01-27 ENCOUNTER — Ambulatory Visit (HOSPITAL_COMMUNITY)
Admission: EM | Admit: 2022-01-27 | Discharge: 2022-01-27 | Disposition: A | Discharge status: Home / Self Care | Payer: Medicaid Other | Attending: Emergency Medicine | Admitting: Emergency Medicine

## 2022-01-27 ENCOUNTER — Telehealth: Payer: Medicaid Other | Admitting: Family Medicine

## 2022-01-27 ENCOUNTER — Encounter (HOSPITAL_COMMUNITY): Payer: Self-pay

## 2022-01-27 ENCOUNTER — Ambulatory Visit: Payer: Self-pay

## 2022-01-27 DIAGNOSIS — R369 Urethral discharge, unspecified: Secondary | ICD-10-CM | POA: Insufficient documentation

## 2022-01-27 NOTE — ED Triage Notes (Signed)
Pt c/o penile discharge. He was seen 1 week ago and his symptoms have not improve.

## 2022-01-27 NOTE — ED Provider Notes (Signed)
[facility]    CSN: [id number] Arrival date & time: 01/27/22  1812    HISTORY   Chief Complaint  Patient presents with   Exposure to STD   HPI Justin Middleton is a 56 y.o. male. Patient presents urgent care today complaining of penile discharge.  Patient states he was seen a week ago for the same symptoms.  EMR reviewed, patient was tested for STDs and all results were negative.  Patient was not provided with any empiric treatment during that visit.  Is requesting provider swab his urethra he believes he might noted on right.  The history is provided by the patient.  Past Medical History:  Diagnosis Date   No pertinent past medical history    Polysubstance abuse [facility])    Patient Active Problem List   Diagnosis Date Noted   Overdose of opiate or related narcotic ([place]) 08/31/2020   Acute encephalopathy 08/31/2020   Accidental drug overdose    Hypokalemia    Increased anion gap metabolic acidosis    AKI (acute kidney injury) ([place])    History reviewed. No pertinent surgical history.  Home Medications    Prior to Admission medications   Medication Sig Start Date End Date Taking? Authorizing Provider  naproxen (NAPROSYN) 375 MG tablet Take 1 tablet (375 mg total) by mouth 2 (two) times daily. 01/17/22   Lamptey, Myrene Galas, MD   Family History Family History  Problem Relation Age of Onset   Diabetes Mother    Healthy Father    Social History Social History   Tobacco Use   Smoking status: Never   Smokeless tobacco: Never  Vaping Use   Vaping Use: Never used  Substance Use Topics   Alcohol use: Yes    Comment: weekends   Drug use: Yes    Types: Amphetamines, Cocaine, Fentanyl   Allergies   Patient has no known allergies.  Review of Systems Review of Systems Pertinent findings noted in history of present illness.   Physical Exam Triage Vital Signs ED Triage Vitals  Enc Vitals Group     BP 07/05/21 0827 (!) 147/82     Pulse Rate 07/05/21 0827 72      Resp 07/05/21 0827 18     Temp 07/05/21 0827 98.3 F (36.8 C)     Temp Source 07/05/21 0827 Oral     SpO2 07/05/21 0827 98 %     Weight --      Height --      Head Circumference --      Peak Flow --      Pain Score 07/05/21 0826 5     Pain Loc --      Pain Edu? --      Excl. in [place]? --   No data found.  Updated Vital Signs BP (!) 167/96 (BP Location: Left Arm)   Pulse (!) 58   Temp 97.7 F (36.5 C) (Oral)   Resp 16   SpO2 100%   Physical Exam Vitals and nursing note reviewed.  Constitutional:      General: He is not in acute distress.    Appearance: Normal appearance. He is not ill-appearing.  HENT:     Head: Normocephalic and atraumatic.  Eyes:     General: Lids are normal.        Right eye: No discharge.        Left eye: No discharge.     Extraocular Movements: Extraocular movements intact.     Conjunctiva/sclera: Conjunctivae  normal.     Right eye: Right conjunctiva is not injected.     Left eye: Left conjunctiva is not injected.  Neck:     Trachea: Trachea and phonation normal.  Cardiovascular:     Rate and Rhythm: Normal rate and regular rhythm.     Pulses: Normal pulses.     Heart sounds: Normal heart sounds. No murmur heard.   No friction rub. No gallop.  Pulmonary:     Effort: Pulmonary effort is normal. No accessory muscle usage, prolonged expiration or respiratory distress.     Breath sounds: Normal breath sounds. No stridor, decreased air movement or transmitted upper airway sounds. No decreased breath sounds, wheezing, rhonchi or rales.  Chest:     Chest wall: No tenderness.  Genitourinary:    Comments: Pt politely declines GU exam, penile swab performed by provider for STD testing Musculoskeletal:        General: Normal range of motion.     Cervical back: Normal range of motion and neck supple. Normal range of motion.  Lymphadenopathy:     Cervical: No cervical adenopathy.  Skin:    General: Skin is warm and dry.     Findings: No erythema or  rash.  Neurological:     General: No focal deficit present.     Mental Status: He is alert and oriented to person, place, and time.  Psychiatric:        Mood and Affect: Mood normal.        Behavior: Behavior normal.    Visual Acuity Right Eye Distance:   Left Eye Distance:   Bilateral Distance:    Right Eye Near:   Left Eye Near:    Bilateral Near:     UC Couse / Diagnostics / Procedures:    EKG  Radiology No results found.  Procedures Procedures (including critical care time)  UC Diagnoses / Final Clinical Impressions(s)   I have reviewed the triage vital signs and the nursing notes.  Pertinent labs & imaging results that were available during my care of the patient were reviewed by me and considered in my medical decision making (see chart for details).    Final diagnoses:  Penile discharge   STD screening was performed, patient advised that the results be posted to their MyChart and if any of the results are positive, they will be notified by phone, further treatment will be provided as indicated based on results of STD screening.    ED Prescriptions   None    PDMP not reviewed this encounter.  Pending results:  Labs Reviewed  CYTOLOGY, (ORAL, ANAL, URETHRAL) ANCILLARY ONLY    Medications Ordered in UC: Medications - No data to display  Disposition Upon Discharge:  Condition: stable for discharge home  Patient presented with concern for an acute illness with associated systemic symptoms and significant discomfort requiring urgent management. In my opinion, this is a condition that a prudent lay person (someone who possesses an average knowledge of health and medicine) may potentially expect to result in complications if not addressed urgently such as respiratory distress, impairment of bodily function or dysfunction of bodily organs.   As such, the patient has been evaluated and assessed, work-up was performed and treatment was provided in alignment with  urgent care protocols and evidence based medicine.  Patient/parent/caregiver has been advised that the patient may require follow up for further testing and/or treatment if the symptoms continue in spite of treatment, as clinically indicated and appropriate.  Routine  symptom specific, illness specific and/or disease specific instructions were discussed with the patient and/or caregiver at length.  Prevention strategies for avoiding STD exposure were also discussed.  The patient will follow up with their current PCP if and as advised. If the patient does not currently have a PCP we will assist them in obtaining one.   The patient may need specialty follow up if the symptoms continue, in spite of conservative treatment and management, for further workup, evaluation, consultation and treatment as clinically indicated and appropriate.  Patient/parent/caregiver verbalized understanding and agreement of plan as discussed.  All questions were addressed during visit.  Please see discharge instructions below for further details of plan.  Discharge Instructions:   Discharge Instructions      The results of your STD testing today will be made available to you once they are complete, this typically takes 3 to 5 days.  They will initially be posted to your MyChart and, if any of your results are abnormal, you will receive a phone call with those results along with further instructions regarding treatment and any prescriptions, if needed.   There are no records in the [facility] health system of your having had an x-ray of your left knee or lower back.  Perhaps you had those done in another healthcare system such as [facility] or [facility].  I recommend that you reach out to their medical records departments to locate those reports.   Thank you for visiting urgent care today.  I appreciate the opportunity to participate in your care.       This office note has been dictated using Museum/gallery curator.   Unfortunately, and despite my best efforts, this method of dictation can sometimes lead to occasional typographical or grammatical errors.  I apologize in advance if this occurs.      Lynden Oxford Scales, PA-C 01/27/22 1958

## 2022-01-27 NOTE — Discharge Instructions (Addendum)
The results of your STD testing today will be made available to you once they are complete, this typically takes 3 to 5 days.  They will initially be posted to your MyChart and, if any of your results are abnormal, you will receive a phone call with those results along with further instructions regarding treatment and any prescriptions, if needed.   There are no records in the [facility] health system of your having had an x-ray of your left knee or lower back.  Perhaps you had those done in another healthcare system such as [facility] or [facility].  I recommend that you reach out to their medical records departments to locate those reports.   Thank you for visiting urgent care today.  I appreciate the opportunity to participate in your care.

## 2022-01-27 NOTE — Telephone Encounter (Addendum)
Chief Complaint: Scrotal pain, penis drainage/burning Symptoms: pain at a 5, more drainage from penis than when seen in UC on 01/17/22 Frequency: pain started this morning, constant pain Pertinent Negatives: Patient denies other symptoms Disposition: [] ED /[] Urgent Care (no appt availability in office) / [] Appointment(In office/virtual)/ [x]  [place] [facility]/ [] Home Care/ [] Refused Recommended Disposition /[] [place] [facility]/ []  Follow-up with PCP Additional Notes: Patient says he came up her and was tested for STD and is insistent on something to be sent to the pharmacy for the symptoms, advised he will need another visit, he says he is at work now and will be able to go to the pharmacy after work saying he has some STD and doesn't know why it turned out negative. Advised I could schedule a virtual UC for the provider to advise on what to do for the symptoms, patient verbalized understanding.     Summary: Advice for pain and discharge   Pt is calling to report that he was STD tested and the test was negative. Pt is calling to report that he is have pain 5/10 and a discharge from his penis. Pt is requesting medication. Pt states that he has already been evaluated and is requesting medication. Please advise      Reason for Disposition  [1] Pain comes and goes (intermittent) AND [2] present > 24 hours  Answer Assessment - Initial Assessment Questions 1. LOCATION and RADIATION: "Where is the pain located?"      Both 2. QUALITY: "What does the pain feel like?"  (e.g., sharp, dull, aching, burning)     Aching pain 3. SEVERITY: "How bad is the pain?"  (Scale 1-10; or mild, moderate, severe)   - MILD (1-3): doesn't interfere with normal activities    - MODERATE (4-7): interferes with normal activities (e.g., work or school) or awakens from sleep   - SEVERE (8-10): excruciating pain, unable to do any normal activities, difficulty walking     5  4. ONSET: "When did the pain start?"      Today for pain 5. PATTERN: "Does it come and go, or has it been constant since it started?"     Constant 6. SCROTAL APPEARANCE: "What does the scrotum look like?" "Is there any swelling or redness?"      No 7. HERNIA: "Has a doctor ever told you that you have a hernia?"     N/A 8. OTHER SYMPTOMS: "Do you have any other symptoms?" (e.g., fever, abdominal pain, vomiting, difficulty passing urine)     Burning/drainage from penis  Protocols used: Scrotal Pain-A-AH

## 2022-01-27 NOTE — Progress Notes (Signed)
Terminous  It was recommended that the patient be seen in person for on going penis drainage and scrotum pain. He was not happy about this given the recent STD testing on 01/17/22; he feels he should be given a medication to help cover what is occurring and does not want to return to a ED or UC due to cost.   I was going to try to recommend free clinics and testing with health dept, but pt hung up prior to provider having that chance.

## 2022-01-27 NOTE — Patient Instructions (Signed)
Please be seen in at the [facility] Urgent [facility] or health dept for repeat testing and urine sample.

## 2022-01-28 LAB — CYTOLOGY, (ORAL, ANAL, URETHRAL) ANCILLARY ONLY
Chlamydia: NEGATIVE
Comment: NEGATIVE
Comment: NEGATIVE
Comment: NORMAL
Neisseria Gonorrhea: NEGATIVE
Trichomonas: NEGATIVE

## 2022-01-29 ENCOUNTER — Telehealth (HOSPITAL_COMMUNITY): Payer: Self-pay

## 2022-01-29 ENCOUNTER — Ambulatory Visit: Payer: Self-pay

## 2022-01-29 NOTE — Telephone Encounter (Signed)
  Chief Complaint: Information  Symptoms: penile discharge Frequency: ongoing Pertinent Negatives: Patient denies  Disposition: [] ED /[] Urgent Care (no appt availability in office) / [] Appointment(In office/virtual)/ []  [place] [facility]/ [] Home Care/ [] Refused Recommended Disposition /[] [place] [facility]/ [x]  Follow-up with PCP Additional Notes: Pt was concerned that STI testing was not accurate. Pt will follow up with urologist or PCP. Reason for Disposition  Health Information question, no triage required and triager able to answer question  Answer Assessment - Initial Assessment Questions 1. REASON FOR CALL or QUESTION: "What is your reason for calling today?" or "How can I best help you?" or "What question do you have that I can help answer?"     Are Sti tests accurate?  Protocols used: Information Only Call - No Triage-A-AH

## 2022-01-29 NOTE — Telephone Encounter (Signed)
Called patient- regarding negative test results. Pt has been advised to follow- up with PCP for further testing.

## 2022-02-07 ENCOUNTER — Telehealth: Payer: Self-pay | Admitting: *Deleted

## 2022-02-07 NOTE — Telephone Encounter (Signed)
Pt has already been given these negative results by another RN recently. He is still having symptoms and believes we are not being honest with him. I read each result to him, they are all negative. I told him that the provider who ordered labs suggested he see a urologist or at the least a PCP. Pt states he is looking for a PCP but first he is going to UC to see if they can help him instead of trying to see a urologist.

## 2022-02-12 ENCOUNTER — Ambulatory Visit: Payer: Medicaid Other | Admitting: Nurse Practitioner

## 2022-02-27 ENCOUNTER — Telehealth (INDEPENDENT_AMBULATORY_CARE_PROVIDER_SITE_OTHER): Payer: Self-pay | Admitting: Primary Care

## 2022-02-27 NOTE — Telephone Encounter (Signed)
Copied from CRM [id number]. Topic: Appointment Scheduling - Scheduling Inquiry for Clinic >> Feb 27, 2022 12:39 PM [name] wrote: Reason for CRM: Pt stated he missed a call from the office with a sooner new pt appt, please advise.

## 2022-02-27 NOTE — Telephone Encounter (Signed)
Office did not contact patient as no sooner appointments available.

## 2022-04-17 ENCOUNTER — Ambulatory Visit (INDEPENDENT_AMBULATORY_CARE_PROVIDER_SITE_OTHER): Payer: Self-pay | Admitting: Primary Care

## 2022-09-05 ENCOUNTER — Ambulatory Visit (INDEPENDENT_AMBULATORY_CARE_PROVIDER_SITE_OTHER): Payer: Self-pay | Admitting: Primary Care

## 2022-10-01 DIAGNOSIS — Z113 Encounter for screening for infections with a predominantly sexual mode of transmission: Secondary | ICD-10-CM | POA: Diagnosis not present

## 2022-10-01 DIAGNOSIS — Z0000 Encounter for general adult medical examination without abnormal findings: Secondary | ICD-10-CM | POA: Diagnosis not present

## 2022-10-01 DIAGNOSIS — Z114 Encounter for screening for human immunodeficiency virus [HIV]: Secondary | ICD-10-CM | POA: Diagnosis not present

## 2022-10-01 DIAGNOSIS — Z131 Encounter for screening for diabetes mellitus: Secondary | ICD-10-CM | POA: Diagnosis not present

## 2022-10-21 DIAGNOSIS — M25562 Pain in left knee: Secondary | ICD-10-CM | POA: Diagnosis not present

## 2022-10-21 DIAGNOSIS — M546 Pain in thoracic spine: Secondary | ICD-10-CM | POA: Diagnosis not present

## 2022-10-21 DIAGNOSIS — M542 Cervicalgia: Secondary | ICD-10-CM | POA: Diagnosis not present

## 2022-10-21 DIAGNOSIS — M25561 Pain in right knee: Secondary | ICD-10-CM | POA: Diagnosis not present

## 2022-11-06 DIAGNOSIS — M1712 Unilateral primary osteoarthritis, left knee: Secondary | ICD-10-CM | POA: Diagnosis not present

## 2022-11-19 DIAGNOSIS — M1712 Unilateral primary osteoarthritis, left knee: Secondary | ICD-10-CM | POA: Diagnosis not present

## 2023-01-21 DIAGNOSIS — N529 Male erectile dysfunction, unspecified: Secondary | ICD-10-CM | POA: Diagnosis not present

## 2023-02-05 ENCOUNTER — Ambulatory Visit (HOSPITAL_COMMUNITY)
Admission: EM | Admit: 2023-02-05 | Discharge: 2023-02-05 | Disposition: A | Discharge status: Home / Self Care | Payer: Medicaid Other | Attending: Physician Assistant | Admitting: Physician Assistant

## 2023-02-05 ENCOUNTER — Encounter (HOSPITAL_COMMUNITY): Payer: Self-pay | Admitting: Emergency Medicine

## 2023-02-05 ENCOUNTER — Other Ambulatory Visit: Payer: Self-pay

## 2023-02-05 DIAGNOSIS — L0291 Cutaneous abscess, unspecified: Secondary | ICD-10-CM

## 2023-02-05 MED ORDER — HYDROCODONE-ACETAMINOPHEN 5-325 MG PO TABS: 1.0000 | ORAL_TABLET | ORAL | 0 refills | Status: AC | PRN

## 2023-02-05 MED ORDER — LIDOCAINE HCL (PF) 1 % IJ SOLN
INTRAMUSCULAR | Status: AC
  Filled 2023-02-05: qty 30

## 2023-02-05 MED ORDER — DOXYCYCLINE HYCLATE 100 MG PO CAPS: 100.0000 mg | ORAL_CAPSULE | Freq: Two times a day (BID) | ORAL | 0 refills | Status: AC

## 2023-02-05 NOTE — ED Triage Notes (Signed)
Patient has an abscess to left thigh.  History of the same.  This episode present for a week.  Patient reports site is draining and he has squeezed it .  Redness and swelling to left thigh  Has not used any medications.  Has applied antibiotic ointment

## 2023-02-05 NOTE — ED Provider Notes (Signed)
[facility]    CSN: [id number] Arrival date & time: 02/05/23  4098      History   Chief Complaint Chief Complaint  Patient presents with   Abscess    HPI Justin Middleton is a 57 y.o. male.   The history is provided by the patient. No language interpreter was used.  Abscess Location:  Leg Leg abscess location:  L lower leg Progression:  Worsening Relieved by:  Nothing   Past Medical History:  Diagnosis Date   No pertinent past medical history    Polysubstance abuse [facility])     Patient Active Problem List   Diagnosis Date Noted   Overdose of opiate or related narcotic (HCC) 08/31/2020   Acute encephalopathy 08/31/2020   Accidental drug overdose    Hypokalemia    Increased anion gap metabolic acidosis    AKI (acute kidney injury) (HCC)     History reviewed. No pertinent surgical history.     Home Medications    Prior to Admission medications   Medication Sig Start Date End Date Taking? Authorizing Provider  doxycycline (VIBRAMYCIN) 100 MG capsule Take 1 capsule (100 mg total) by mouth 2 (two) times daily. 02/05/23  Yes Elson Areas, PA-C  HYDROcodone-acetaminophen (NORCO/VICODIN) 5-325 MG tablet Take 1 tablet by mouth every 4 (four) hours as needed for moderate pain. 02/05/23 02/05/24 Yes Elson Areas, PA-C  naproxen (NAPROSYN) 375 MG tablet Take 1 tablet (375 mg total) by mouth 2 (two) times daily. Patient not taking: Reported on 02/05/2023 01/17/22   Lamptey, Britta Mccreedy, MD    Family History Family History  Problem Relation Age of Onset   Diabetes Mother    Healthy Father     Social History Social History   Tobacco Use   Smoking status: Never   Smokeless tobacco: Never  Vaping Use   Vaping Use: Never used  Substance Use Topics   Alcohol use: Yes    Comment: weekends   Drug use: Yes    Types: Amphetamines, Cocaine, Fentanyl     Allergies   Patient has no known allergies.   Review of Systems Review of Systems  All other systems  reviewed and are negative.    Physical Exam Triage Vital Signs ED Triage Vitals  Enc Vitals Group     BP 02/05/23 1132 (!) 146/89     Pulse Rate 02/05/23 1132 63     Resp 02/05/23 1132 18     Temp 02/05/23 1132 98.3 F (36.8 C)     Temp Source 02/05/23 1132 Oral     SpO2 02/05/23 1132 96 %     Weight --      Height --      Head Circumference --      Peak Flow --      Pain Score 02/05/23 1131 10     Pain Loc --      Pain Edu? --      Excl. in GC? --    No data found.  Updated Vital Signs BP (!) 146/89 (BP Location: Left Arm) Comment (BP Location): large cuff  Pulse 63   Temp 98.3 F (36.8 C) (Oral)   Resp 18   SpO2 96%   Visual Acuity Right Eye Distance:   Left Eye Distance:   Bilateral Distance:    Right Eye Near:   Left Eye Near:    Bilateral Near:     Physical Exam Vitals and nursing note reviewed.  Constitutional:  Appearance: He is well-developed.  HENT:     Head: Normocephalic.  Pulmonary:     Effort: Pulmonary effort is normal.  Abdominal:     General: There is no distension.  Musculoskeletal:        General: Swelling and tenderness present.     Cervical back: Normal range of motion.     Comments: 4cm swollen draining area left thigh  Neurological:     Mental Status: He is alert and oriented to person, place, and time.      UC Treatments / Results  Labs (all labs ordered are listed, but only abnormal results are displayed) Labs Reviewed - No data to display  EKG   Radiology No results found.  Procedures Incision and Drainage  Date/Time: 02/05/2023 1:05 PM  Performed by: Elson Areas, PA-C Authorized by: Elson Areas, PA-C   Consent:    Consent obtained:  Verbal   Consent given by:  Patient   Risks discussed:  Incomplete drainage   Alternatives discussed:  No treatment Universal protocol:    Procedure explained and questions answered to patient or proxy's satisfaction: no     Immediately prior to procedure, a time  out was called: no     Patient identity confirmed:  Verbally with patient Location:    Type:  Abscess   Size:  3 Anesthesia:    Anesthesia method:  Local infiltration   Local anesthetic:  Lidocaine 1% w/o epi Procedure type:    Complexity:  Simple Procedure details:    Incision types:  Single straight   Wound management:  Probed and deloculated   Wound treatment:  Wound left open Post-procedure details:    Procedure completion:  Tolerated with difficulty  (including critical care time)  Medications Ordered in UC Medications - No data to display  Initial Impression / Assessment and Plan / UC Course  I have reviewed the triage vital signs and the nursing notes.  Pertinent labs & imaging results that were available during my care of the patient were reviewed by me and considered in my medical decision making (see chart for details).      Final Clinical Impressions(s) / UC Diagnoses   Final diagnoses:  Abscess     Discharge Instructions      Return if any problems.     ED Prescriptions     Medication Sig Dispense Auth. Provider   doxycycline (VIBRAMYCIN) 100 MG capsule Take 1 capsule (100 mg total) by mouth 2 (two) times daily. 20 capsule Shalece Staffa K, [place]   HYDROcodone-acetaminophen (NORCO/VICODIN) 5-325 MG tablet Take 1 tablet by mouth every 4 (four) hours as needed for moderate pain. 20 tablet Elson Areas, [place]      I have reviewed the PDMP during this encounter. An After Visit Summary was printed and given to the patient.    Elson Areas, [place] 02/05/23 1308

## 2023-02-05 NOTE — Discharge Instructions (Addendum)
Return if any problems.

## 2023-05-20 DIAGNOSIS — Z202 Contact with and (suspected) exposure to infections with a predominantly sexual mode of transmission: Secondary | ICD-10-CM | POA: Diagnosis not present

## 2023-06-22 DIAGNOSIS — R972 Elevated prostate specific antigen [PSA]: Secondary | ICD-10-CM | POA: Diagnosis not present

## 2023-07-28 DIAGNOSIS — F4310 Post-traumatic stress disorder, unspecified: Secondary | ICD-10-CM | POA: Diagnosis not present

## 2023-07-28 NOTE — Therapy (Deleted)
OUTPATIENT PHYSICAL THERAPY THORACOLUMBAR EVALUATION   Patient Name: Justin Middleton MRN: [id number] DOB:1965-10-20, 57 y.o., male Today's Date: 07/28/2023  END OF SESSION:   Past Medical History:  Diagnosis Date   No pertinent past medical history    Polysubstance abuse (HCC)    No past surgical history on file. Patient Active Problem List   Diagnosis Date Noted   Overdose of opiate or related narcotic (HCC) 08/31/2020   Acute encephalopathy 08/31/2020   Accidental drug overdose    Hypokalemia    Increased anion gap metabolic acidosis    AKI (acute kidney injury) (HCC)     PCP: Patient, No Pcp Per   REFERRING PROVIDER: Jamey Reas, PA-C  REFERRING DIAG: [phone] (ICD-10-CM) - Spondylolysis, lumbar region  Rationale for Evaluation and Treatment: Rehabilitation  THERAPY DIAG:  No diagnosis found.  ONSET DATE: ***  SUBJECTIVE:                                                                                                                                                                                           SUBJECTIVE STATEMENT: ***  PERTINENT HISTORY:  None noted  PAIN:  Are you having pain? {OPRCPAIN:27236}  PRECAUTIONS: None  RED FLAGS: None   WEIGHT BEARING RESTRICTIONS: No  FALLS:  Has patient fallen in last 6 months? No  OCCUPATION: not working  PLOF: Independent  PATIENT GOALS: ***  NEXT MD VISIT: ***  OBJECTIVE:  Note: Objective measures were completed at Evaluation unless otherwise noted.  DIAGNOSTIC FINDINGS:  none  PATIENT SURVEYS:  FOTO ***  MUSCLE LENGTH: Hamstrings: Right *** deg; Left *** deg Maisie Fus test: Right *** deg; Left *** deg  POSTURE: {posture:25561}  PALPATION: ***  LUMBAR ROM:   AROM eval  Flexion   Extension   Right lateral flexion   Left lateral flexion   Right rotation   Left rotation    (Blank rows = not tested)  LOWER EXTREMITY ROM:     {AROM/PROM:27142}  Right eval Left eval  Hip  flexion    Hip extension    Hip abduction    Hip adduction    Hip internal rotation    Hip external rotation    Knee flexion    Knee extension    Ankle dorsiflexion    Ankle plantarflexion    Ankle inversion    Ankle eversion     (Blank rows = not tested)  LOWER EXTREMITY MMT:    MMT Right eval Left eval  Hip flexion    Hip extension    Hip abduction    Hip adduction    Hip internal rotation  Hip external rotation    Knee flexion    Knee extension    Ankle dorsiflexion    Ankle plantarflexion    Ankle inversion    Ankle eversion     (Blank rows = not tested)  LUMBAR SPECIAL TESTS:  Straight leg raise test: {pos/neg:25243} and Slump test: {pos/neg:25243}  FUNCTIONAL TESTS:  30 seconds chair stand test  GAIT: Distance walked: *** Assistive device utilized: {Assistive devices:23999} Level of assistance: {Levels of assistance:24026} Comments: ***  TODAY'S TREATMENT:                                                                                                                              DATE: ***    PATIENT EDUCATION:  Education details: Discussed eval findings, rehab rationale and POC and patient is in agreement  Person educated: Patient Education method: Explanation Education comprehension: verbalized understanding and needs further education  HOME EXERCISE PROGRAM: ***  ASSESSMENT:  CLINICAL IMPRESSION: Patient is a *** y.o. *** who was seen today for physical therapy evaluation and treatment for ***.   OBJECTIVE IMPAIRMENTS: {opptimpairments:25111}.   ACTIVITY LIMITATIONS: {activitylimitations:27494}  PARTICIPATION LIMITATIONS: {participationrestrictions:25113}  PERSONAL FACTORS: Age, Fitness, and Time since onset of injury/illness/exacerbation are also affecting patient's functional outcome.   REHAB POTENTIAL: Fair based on chronicity and high reported pain levels  CLINICAL DECISION MAKING: Stable/uncomplicated  EVALUATION COMPLEXITY:  Low   GOALS: Goals reviewed with patient? No  SHORT TERM GOALS: Target date: ***  *** Baseline: Goal status: INITIAL  2.  *** Baseline:  Goal status: INITIAL  3.  *** Baseline:  Goal status: INITIAL  4.  *** Baseline:  Goal status: INITIAL  5.  *** Baseline:  Goal status: INITIAL  6.  *** Baseline:  Goal status: INITIAL  LONG TERM GOALS: Target date: ***  *** Baseline:  Goal status: INITIAL  2.  *** Baseline:  Goal status: INITIAL  3.  *** Baseline:  Goal status: INITIAL  4.  *** Baseline:  Goal status: INITIAL  5.  *** Baseline:  Goal status: INITIAL  6.  *** Baseline:  Goal status: INITIAL  PLAN:  PT FREQUENCY: 1-2x/week  PT DURATION: 6 weeks  PLANNED INTERVENTIONS: 97164- PT Re-evaluation, 97110-Therapeutic exercises, 97530- Therapeutic activity, 97112- Neuromuscular re-education, 97535- Self Care, 84132- Manual therapy, Dry Needling, Cryotherapy, and Moist heat.  PLAN FOR NEXT SESSION: HEP review and update, manual techniques as appropriate, aerobic tasks, ROM and flexibility activities, strengthening and PREs, TPDN, gait and balance training as needed     Hildred Laser, PT 07/28/2023, 3:55 PM

## 2023-07-31 ENCOUNTER — Ambulatory Visit: Payer: Medicaid Other | Attending: Physician Assistant

## 2024-01-29 ENCOUNTER — Other Ambulatory Visit: Payer: Self-pay | Admitting: Urology

## 2024-01-29 DIAGNOSIS — R972 Elevated prostate specific antigen [PSA]: Secondary | ICD-10-CM

## 2024-02-05 ENCOUNTER — Encounter: Payer: Self-pay | Admitting: Urology

## 2024-03-15 ENCOUNTER — Encounter: Payer: Self-pay | Admitting: Urology

## 2024-03-17 ENCOUNTER — Encounter: Payer: Self-pay | Admitting: Urology

## 2024-03-22 ENCOUNTER — Other Ambulatory Visit
